# Patient Record
Sex: Female | Born: 1995 | Race: White | Hispanic: No | Marital: Single | Smoking: Never smoker
Health system: Southern US, Community
[De-identification: ages and names within clinical notes are randomized; demographics above are authoritative.]

## PROBLEM LIST (undated history)

## (undated) DIAGNOSIS — R4689 Other symptoms and signs involving appearance and behavior: Secondary | ICD-10-CM

## (undated) DIAGNOSIS — R569 Unspecified convulsions: Secondary | ICD-10-CM

## (undated) DIAGNOSIS — F840 Autistic disorder: Secondary | ICD-10-CM

## (undated) HISTORY — PX: MYRINGOTOMY WITH TUBE PLACEMENT: SHX5663

## (undated) HISTORY — PX: CARDIAC CATHETERIZATION: SHX172

---
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
## 2014-04-02 ENCOUNTER — Emergency Department: Payer: Self-pay | Admitting: Emergency Medicine

## 2014-04-02 LAB — CBC
HCT: 39.7 % (ref 35.0–47.0)
HGB: 13.6 g/dL (ref 12.0–16.0)
MCH: 31 pg (ref 26.0–34.0)
MCHC: 34.3 g/dL (ref 32.0–36.0)
MCV: 91 fL (ref 80–100)
Platelet: 236 10*3/uL (ref 150–440)
RBC: 4.38 10*6/uL (ref 3.80–5.20)
RDW: 13.3 % (ref 11.5–14.5)
WBC: 9.8 10*3/uL (ref 3.6–11.0)

## 2014-04-02 LAB — PREGNANCY, URINE: PREGNANCY TEST, URINE: NEGATIVE m[IU]/mL

## 2014-04-02 LAB — URINALYSIS, COMPLETE
BACTERIA: NONE SEEN
Bilirubin,UR: NEGATIVE
Blood: NEGATIVE
Glucose,UR: NEGATIVE mg/dL (ref 0–75)
Ketone: NEGATIVE
LEUKOCYTE ESTERASE: NEGATIVE
Nitrite: NEGATIVE
Ph: 8 (ref 4.5–8.0)
Protein: 30
RBC,UR: 1 /HPF (ref 0–5)
Specific Gravity: 1.015 (ref 1.003–1.030)
Squamous Epithelial: 2

## 2014-04-02 LAB — BASIC METABOLIC PANEL
Anion Gap: 9 (ref 7–16)
BUN: 13 mg/dL (ref 7–18)
CO2: 20 mmol/L — AB (ref 21–32)
Calcium, Total: 9.5 mg/dL (ref 9.0–10.7)
Chloride: 109 mmol/L — ABNORMAL HIGH (ref 98–107)
Creatinine: 0.77 mg/dL (ref 0.60–1.30)
EGFR (African American): >60
GLUCOSE: 81 mg/dL (ref 65–99)
Osmolality: 275 (ref 275–301)
Potassium: 4 mmol/L (ref 3.5–5.1)
Sodium: 138 mmol/L (ref 136–145)

## 2014-04-08 ENCOUNTER — Encounter (HOSPITAL_COMMUNITY): Payer: Self-pay | Admitting: Emergency Medicine

## 2014-04-08 ENCOUNTER — Emergency Department (HOSPITAL_COMMUNITY): Payer: 59

## 2014-04-08 ENCOUNTER — Emergency Department (HOSPITAL_COMMUNITY)
Admission: EM | Admit: 2014-04-08 | Discharge: 2014-04-09 | Disposition: A | Discharge status: Home / Self Care | Payer: 59 | Attending: Emergency Medicine | Admitting: Emergency Medicine

## 2014-04-08 DIAGNOSIS — F840 Autistic disorder: Secondary | ICD-10-CM | POA: Insufficient documentation

## 2014-04-08 DIAGNOSIS — Z3202 Encounter for pregnancy test, result negative: Secondary | ICD-10-CM | POA: Insufficient documentation

## 2014-04-08 DIAGNOSIS — Z79899 Other long term (current) drug therapy: Secondary | ICD-10-CM | POA: Insufficient documentation

## 2014-04-08 DIAGNOSIS — Z9889 Other specified postprocedural states: Secondary | ICD-10-CM | POA: Insufficient documentation

## 2014-04-08 DIAGNOSIS — G40909 Epilepsy, unspecified, not intractable, without status epilepticus: Secondary | ICD-10-CM | POA: Insufficient documentation

## 2014-04-08 DIAGNOSIS — IMO0002 Reserved for concepts with insufficient information to code with codable children: Secondary | ICD-10-CM | POA: Insufficient documentation

## 2014-04-08 DIAGNOSIS — Q353 Cleft soft palate: Secondary | ICD-10-CM | POA: Insufficient documentation

## 2014-04-08 DIAGNOSIS — R569 Unspecified convulsions: Secondary | ICD-10-CM

## 2014-04-08 HISTORY — DX: Other symptoms and signs involving appearance and behavior: R46.89

## 2014-04-08 HISTORY — DX: Unspecified convulsions: R56.9

## 2014-04-08 HISTORY — DX: Autistic disorder: F84.0

## 2014-04-08 LAB — RAPID URINE DRUG SCREEN, HOSP PERFORMED
Amphetamines: NOT DETECTED
Barbiturates: NOT DETECTED
Benzodiazepines: NOT DETECTED
COCAINE: NOT DETECTED
OPIATES: NOT DETECTED
TETRAHYDROCANNABINOL: NOT DETECTED

## 2014-04-08 LAB — URINALYSIS, ROUTINE W REFLEX MICROSCOPIC
Bilirubin Urine: NEGATIVE
Glucose, UA: NEGATIVE mg/dL
Hgb urine dipstick: NEGATIVE
Ketones, ur: NEGATIVE mg/dL
Leukocytes, UA: NEGATIVE
NITRITE: NEGATIVE
Protein, ur: 30 mg/dL — AB
SPECIFIC GRAVITY, URINE: 1.019 (ref 1.005–1.030)
UROBILINOGEN UA: 1 mg/dL (ref 0.0–1.0)
pH: 7.5 (ref 5.0–8.0)

## 2014-04-08 LAB — CBC WITH DIFFERENTIAL/PLATELET
BASOS ABS: 0 10*3/uL (ref 0.0–0.1)
Basophils Relative: 0 % (ref 0–1)
EOS ABS: 0.1 10*3/uL (ref 0.0–0.7)
Eosinophils Relative: 1 % (ref 0–5)
HCT: 39.9 % (ref 36.0–46.0)
HEMOGLOBIN: 13.3 g/dL (ref 12.0–15.0)
Lymphocytes Relative: 26 % (ref 12–46)
Lymphs Abs: 2.4 10*3/uL (ref 0.7–4.0)
MCH: 29.8 pg (ref 26.0–34.0)
MCHC: 33.3 g/dL (ref 30.0–36.0)
MCV: 89.3 fL (ref 78.0–100.0)
MONOS PCT: 4 % (ref 3–12)
Monocytes Absolute: 0.4 10*3/uL (ref 0.1–1.0)
NEUTROS ABS: 6.2 10*3/uL (ref 1.7–7.7)
Neutrophils Relative %: 69 % (ref 43–77)
PLATELETS: 272 10*3/uL (ref 150–400)
RBC: 4.47 MIL/uL (ref 3.87–5.11)
RDW: 13 % (ref 11.5–15.5)
WBC: 9 10*3/uL (ref 4.0–10.5)

## 2014-04-08 LAB — HEPATIC FUNCTION PANEL
ALBUMIN: 3.8 g/dL (ref 3.5–5.2)
ALK PHOS: 51 U/L (ref 39–117)
ALT: 20 U/L (ref 0–35)
AST: 22 U/L (ref 0–37)
Bilirubin, Direct: <0.2 mg/dL (ref 0.0–0.3)
Total Bilirubin: 0.2 mg/dL — ABNORMAL LOW (ref 0.3–1.2)
Total Protein: 7.7 g/dL (ref 6.0–8.3)

## 2014-04-08 LAB — URINE MICROSCOPIC-ADD ON

## 2014-04-08 LAB — PREGNANCY, URINE: Preg Test, Ur: NEGATIVE

## 2014-04-08 MED ORDER — LEVETIRACETAM IN NACL 500 MG/100ML IV SOLN
500.0000 mg | Freq: Once | INTRAVENOUS | Status: AC
  Administered 2014-04-09: 500 mg via INTRAVENOUS
  Filled 2014-04-08: qty 100

## 2014-04-08 NOTE — ED Notes (Addendum)
Pt transported from highway from vehicle after reported seizure activity. Pt did have urinary incontinence per EMS. Family states she had sx at 18 yo then last week after head injury Per mother unkn if TBI was due to seizure or if seizure caused her to hit her head on night stand

## 2014-04-08 NOTE — ED Notes (Signed)
Patient transported to CT 

## 2014-04-08 NOTE — ED Provider Notes (Signed)
CSN: [id number]     Arrival date & time 04/08/14  2021 History   First MD Initiated Contact with Patient 04/08/14 2150     Chief Complaint  Patient presents with  . Seizures     (Consider location/radiation/quality/duration/timing/severity/associated sxs/prior Treatment) HPI  Grace Garcia is a 18 y.o. female accompanied by her mother who provides most of the history, patient has moderate to severe autism complaining of seizure-like activity. This is the second time this week. Initially, patient was bouncing on her yoga ball came off of the ball hit her head against a TV stand briefly lost consciousness for about 30 seconds and was convulsing. This was witnessed by her younger sister who is 50 years old. There was no loss of our bladder continence, patient was confused afterwards she became ataxic as she was walking and hit her head against the wall second time. This is atypical for her she is normally very steady on her feet. Patient was evaluated at [facility] and had normal CT scan, blood work, urinalysis. Patient had been in her normal state of health this week when her grandparents are taking her out to dinner this evening she was sitting in the backseat: Grandmother states that patient's eyes rolled back in her head she began convulsing. This lasted for 1 minute. As per EMS there was loss of bladder continence. She was mildly confused afterward. Mother reports that she is eating and drinking normally. No fever chills or vomiting.   Past Medical History  Diagnosis Date  . Seizures   . Autistic behavior    Past Surgical History  Procedure Laterality Date  . Myringotomy with tube placement    . Cardiac catheterization      to repair heart defect with mesh   No family history on file. History  Substance Use Topics  . Smoking status: Never Smoker   . Smokeless tobacco: Not on file  . Alcohol Use: No   OB History   Grav Para Term Preterm Abortions TAB SAB Ect Mult Living                  Review of Systems  10 systems reviewed and found to be negative, except as noted in the HPI.   Allergies  Review of patient's allergies indicates no known allergies.  Home Medications   Prior to Admission medications   Medication Sig Start Date End Date Taking? Authorizing Provider  acetaminophen (TYLENOL) 500 MG tablet Take 500 mg by mouth every 6 (six) hours as needed (pain).   Yes Historical Provider, MD  levETIRAcetam (KEPPRA) 500 MG tablet Take 1 tablet (500 mg total) by mouth 2 (two) times daily. 04/09/14   Keilany Burnette, PA-C  mometasone (NASONEX) 50 MCG/ACT nasal spray Place 2 sprays into the nose daily.   Yes Historical Provider, MD  norgestimate-ethinyl estradiol (ORTHO-CYCLEN,SPRINTEC,PREVIFEM) 0.25-35 MG-MCG tablet Take 1 tablet by mouth daily.   Yes Historical Provider, MD  omega-3 acid ethyl esters (LOVAZA) 1 G capsule Take 1 g by mouth daily.   Yes Historical Provider, MD   BP 132/75  Pulse 82  Temp(Src) 98.1 F (36.7 C) (Oral)  Resp 18  Ht 5\' 1"  (1.549 m)  Wt 124 lb (56.246 kg)  BMI 23.44 kg/m2  SpO2 100%  LMP 02/06/2014 Physical Exam  Nursing note and vitals reviewed. Constitutional: She is oriented to person, place, and time. She appears well-developed and well-nourished. No distress.  Tearful  HENT:  Head: Normocephalic and atraumatic.  Mouth/Throat: Oropharynx is  clear and moist.  Bifid uvula  Eyes: Conjunctivae and EOM are normal. Pupils are equal, round, and reactive to light.  Neck: Normal range of motion. Neck supple.  Cardiovascular: Normal rate, regular rhythm and intact distal pulses.   Pulmonary/Chest: Effort normal and breath sounds normal. No stridor. No respiratory distress. She has no wheezes. She has no rales. She exhibits no tenderness.  Abdominal: Soft. Bowel sounds are normal. She exhibits no distension and no mass. There is no tenderness. There is no rebound and no guarding.  Musculoskeletal: Normal range of motion.   Neurological: She is alert and oriented to person, place, and time.  Verbal but very slow to respond, follows commands, moving all extremities  Psychiatric: She has a normal mood and affect.    ED Course  Procedures (including critical care time) Labs Review Labs Reviewed  URINALYSIS, ROUTINE W REFLEX MICROSCOPIC - Abnormal; Notable for the following:    Protein, ur 30 (*)    All other components within normal limits  HEPATIC FUNCTION PANEL - Abnormal; Notable for the following:    Total Bilirubin 0.2 (*)    All other components within normal limits  CBC WITH DIFFERENTIAL  URINE RAPID DRUG SCREEN (HOSP PERFORMED)  PREGNANCY, URINE  URINE MICROSCOPIC-ADD ON    Imaging Review Ct Head Wo Contrast  04/09/2014   CLINICAL DATA:  Seizure.  EXAM: CT HEAD WITHOUT CONTRAST  TECHNIQUE: Contiguous axial images were obtained from the base of the skull through the vertex without intravenous contrast.  COMPARISON:  CT of the head April 02, 2014  FINDINGS: The ventricles and sulci are normal. No intraparenchymal hemorrhage, mass effect nor midline shift. No acute large vascular territory infarcts.  No abnormal extra-axial fluid collections. Basal cisterns are patent.  No skull fracture. The included ocular globes and orbital contents are non-suspicious. Completed soft tissue opacification of the right maxillary sinus, incompletely characterized, unchanged. Under pneumatized mastoid air cells with soft tissue effacing left middle ear and mastoid air cells.  IMPRESSION: No acute intracranial process; stable appearance of the head from April 02, 2014.  Left middle ear and mastoid effusion. Right maxillary probable mucosal retention cyst versus mucocele, incompletely characterized.   Electronically Signed   By: Awilda Metroourtnay  Bloomer   On: 04/09/2014 00:18     EKG Interpretation None      MDM   Final diagnoses:  Seizure-like activity    Filed Vitals:   04/08/14 2145 04/09/14 0037  BP: 129/73 132/75   Pulse: 99 82  Temp: 98.1 F (36.7 C)   TempSrc: Oral   Resp: 18 18  Height: 5\' 1"  (1.549 m)   Weight: 124 lb (56.246 kg)   SpO2: 100% 100%    Medications  levETIRAcetam (KEPPRA) IVPB 500 mg/100 mL premix (500 mg Intravenous Given 04/09/14 0018)    Martha ClanMackenzie E Debruler is a 18 y.o. female presenting with seizure-like activity 2 times this week. Patient had trauma earlier in the normal CT. Full neuro exam is not possible do to her autism. However repair exam is nonfocal. Case discussed with attending physician. Plan is to obtain basic blood work and urinalysis discuss with neurology.   Blood work and urinalysis with no abnormalities. Neurology consult from Dr. Roseanne RenoStewart appreciated: Recommends getting a second head CT tonight. Recommends loading with 500 mg IV Her and starting her on 500 twice a day.   Evaluation does not show pathology that would require ongoing emergent intervention or inpatient treatment. Pt is hemodynamically stable and mentating appropriately. Discussed  findings and plan with patient/guardian, who agrees with care plan. All questions answered. Return precautions discussed and outpatient follow up given.   Discharge Medication List as of 04/09/2014 12:26 AM    START taking these medications   Details  levETIRAcetam (KEPPRA) 500 MG tablet Take 1 tablet (500 mg total) by mouth 2 (two) times daily., Starting 04/09/2014, Until Discontinued, [employer], PA-C 04/09/14 [phone]

## 2014-04-09 MED ORDER — LEVETIRACETAM 500 MG PO TABS: 500.0000 mg | ORAL_TABLET | Freq: Two times a day (BID) | ORAL | Status: DC

## 2014-04-09 NOTE — Discharge Instructions (Signed)
Please follow with your primary care doctor in the next 2 days for a check-up. They must obtain records for further management.   Do not hesitate to return to the Emergency Department for any new, worsening or concerning symptoms.    Epilepsy Epilepsy is a disorder in which a person has repeated seizures over time. A seizure is a release of abnormal electrical activity in the brain. Seizures can cause a change in attention, behavior, or the ability to remain awake and alert (altered mental status). Seizures often involve uncontrollable shaking (convulsions).  Most people with epilepsy lead normal lives. However, people with epilepsy are at an increased risk of falls, accidents, and injuries. Therefore, it is important to begin treatment right away. CAUSES  Epilepsy has many possible causes. Anything that disturbs the normal pattern of brain cell activity can lead to seizures. This may include:   Head injury.  Birth trauma.  High fever as a child.  Stroke.  Bleeding into or around the brain.  Certain drugs.  Prolonged low oxygen, such as what occurs after CPR efforts.  Abnormal brain development.  Certain illnesses, such as meningitis, encephalitis (brain infection), malaria, and other infections.  An imbalance of nerve signaling chemicals (neurotransmitters).  SIGNS AND SYMPTOMS  The symptoms of a seizure can vary greatly from one person to another. Right before a seizure, you may have a warning (aura) that a seizure is about to occur. An aura may include the following symptoms:  Fear or anxiety.  Nausea.  Feeling like the room is spinning (vertigo).  Vision changes, such as seeing flashing lights or spots. Common symptoms during a seizure include:  Abnormal sensations, such as an abnormal smell or a bitter taste in the mouth.   Sudden, general body stiffness.   Convulsions that involve rhythmic jerking of the face, arm, or leg on one or both sides.   Sudden change  in consciousness.   Appearing to be awake but not responding.   Appearing to be asleep but cannot be awakened.   Grimacing, chewing, lip smacking, drooling, tongue biting, or loss of bowel or bladder control. After a seizure, you may feel sleepy for a while. DIAGNOSIS  Your health care provider will ask about your symptoms and take a medical history. Descriptions from any witnesses to your seizures will be very helpful in the diagnosis. A physical exam, including a detailed neurological exam, is necessary. Various tests may be done, such as:   An electroencephalogram (EEG). This is a painless test of your brain waves. In this test, a diagram is created of your brain waves. These diagrams can be interpreted by a specialist.  An MRI of the brain.   A CT scan of the brain.   A spinal tap (lumbar puncture, LP).  Blood tests to check for signs of infection or abnormal blood chemistry. TREATMENT  There is no cure for epilepsy, but it is generally treatable. Once epilepsy is diagnosed, it is important to begin treatment as soon as possible. For most people with epilepsy, seizures can be controlled with medicines. The following may also be used:  A pacemaker for the brain (vagus nerve stimulator) can be used for people with seizures that are not well controlled by medicine.  Surgery on the brain. For some people, epilepsy eventually goes away. HOME CARE INSTRUCTIONS   Follow your health care provider's recommendations on driving and safety in normal activities.  Get enough rest. Lack of sleep can cause seizures.  Only take over-the-counter or  prescription medicines as directed by your health care provider. Take any prescribed medicine exactly as directed.  Avoid any known triggers of your seizures.  Keep a seizure diary. Record what you recall about any seizure, especially any possible trigger.   Make sure the people you live and work with know that you are prone to seizures.  They should receive instructions on how to help you. In general, a witness to a seizure should:   Cushion your head and body.   Turn you on your side.   Avoid unnecessarily restraining you.   Not place anything inside your mouth.   Call for emergency medical help if there is any question about what has occurred.   Follow up with your health care provider as directed. You may need regular blood tests to monitor the levels of your medicine.  SEEK MEDICAL CARE IF:   You develop signs of infection or other illness. This might increase the risk of a seizure.   You seem to be having more frequent seizures.   Your seizure pattern is changing.  SEEK IMMEDIATE MEDICAL CARE IF:   You have a seizure that does not stop after a few moments.   You have a seizure that causes any difficulty in breathing.   You have a seizure that results in a very severe headache.   You have a seizure that leaves you with the inability to speak or use a part of your body.  Document Released: 09/14/2005 Document Revised: 07/05/2013 Document Reviewed: 04/26/2013 [facility] Patient Information 2015 [place], [place]. This information is not intended to replace advice given to you by your health care provider. Make sure you discuss any questions you have with your health care provider.

## 2014-04-11 NOTE — ED Provider Notes (Signed)
Medical screening examination/treatment/procedure(s) were performed by non-physician practitioner and as supervising physician I was immediately available for consultation/collaboration.    Linwood DibblesJon Doral Ventrella, MD 04/11/14 [phone]

## 2014-04-28 DIAGNOSIS — R569 Unspecified convulsions: Secondary | ICD-10-CM | POA: Insufficient documentation

## 2016-01-07 ENCOUNTER — Ambulatory Visit (INDEPENDENT_AMBULATORY_CARE_PROVIDER_SITE_OTHER): Payer: 59 | Admitting: Family Medicine

## 2016-01-07 ENCOUNTER — Other Ambulatory Visit: Payer: Self-pay

## 2016-01-07 ENCOUNTER — Encounter: Payer: Self-pay | Admitting: Family Medicine

## 2016-01-07 VITALS — BP 92/60 | HR 82 | Temp 98.2°F | Resp 14 | Ht 63.0 in | Wt 131.0 lb

## 2016-01-07 DIAGNOSIS — R569 Unspecified convulsions: Secondary | ICD-10-CM

## 2016-01-07 DIAGNOSIS — F840 Autistic disorder: Secondary | ICD-10-CM | POA: Diagnosis not present

## 2016-01-07 DIAGNOSIS — N926 Irregular menstruation, unspecified: Secondary | ICD-10-CM

## 2016-01-07 DIAGNOSIS — Z7189 Other specified counseling: Secondary | ICD-10-CM | POA: Diagnosis not present

## 2016-01-07 DIAGNOSIS — Z8669 Personal history of other diseases of the nervous system and sense organs: Secondary | ICD-10-CM | POA: Diagnosis not present

## 2016-01-07 DIAGNOSIS — Z7689 Persons encountering health services in other specified circumstances: Secondary | ICD-10-CM

## 2016-01-07 NOTE — Progress Notes (Signed)
Patient ID: Grace Garcia, female   DOB: 1995/12/03, 20 y.o.   MRN: [id number]    Subjective:  HPI  Patient is here to get established. She use to see [facility].  She would like to get irregular periods discussed. This has been an issue for 2 years, she has tried different BC medications-oral only. She has been on [facility] for 1 year now. Per patient's mom who is present with patient today, patient usually will have a period every 1 to 2 months and it was last for about 3 weeks. Flow is light. She does not see a gynecologist. Has been on continuous pills for over a year now as is much slower than usual when on her cycle.   Is also on medication for seizure disorder. Tolerating it well. No side effects. No recent seizures.   Going to participate in [employer] this fall if she is accepted.    Prior to Admission medications   Medication Sig Start Date End Date Taking? Authorizing Provider  acetaminophen (TYLENOL) 500 MG tablet Take 500 mg by mouth every 6 (six) hours as needed (pain).   Yes Historical Provider, MD  CIPRODEX otic suspension As needed 12/20/15  Yes Historical Provider, MD  lamoTRIgine (LAMICTAL) 100 MG tablet 1 1/2 tablets twice daily 12/20/15  Yes Historical Provider, MD  levETIRAcetam (KEPPRA) 750 MG tablet Take 750 mg by mouth 2 (two) times daily.  10/22/15  Yes Historical Provider, MD  mometasone (NASONEX) 50 MCG/ACT nasal spray Place 2 sprays into the nose daily.   Yes Historical Provider, MD  norgestimate-ethinyl estradiol (ORTHO-CYCLEN,SPRINTEC,PREVIFEM) 0.25-35 MG-MCG tablet Take 1 tablet by mouth daily.   Yes Historical Provider, MD    Patient Active Problem List   Diagnosis Date Noted  . Active autistic disorder 01/07/2016  . Seizure (HCC) 04/28/2014    Past Medical History  Diagnosis Date  . Seizures (HCC)   . Autistic behavior     Social History   Social History  . Marital Status: Single    Spouse Name: N/A  . Number of Children: N/A    . Years of Education: N/A   Occupational History  . Not on file.   Social History Main Topics  . Smoking status: Never Smoker   . Smokeless tobacco: Never Used  . Alcohol Use: No  . Drug Use: No  . Sexual Activity: No   Other Topics Concern  . Not on file   Social History Narrative    No Known Allergies  Review of Systems  Constitutional: Negative.   Respiratory: Negative.   Cardiovascular: Negative.   Musculoskeletal: Negative.   Psychiatric/Behavioral: Negative.     Objective:  BP 92/60 mmHg  Pulse 82  Temp(Src) 98.2 F (36.8 C)  Resp 14  Ht  (1.6 m)  Wt 131 lb (59.421 kg)  BMI 23.21 kg/m2  LMP 01/02/2016  Physical Exam  Constitutional: She is well-developed, well-nourished, and in no distress.  HENT:  Right Ear: External ear normal.  Ear tubes are present bilateral  Neck: Normal range of motion. Neck supple. No thyromegaly present.  Cardiovascular: Normal rate, regular rhythm, normal heart sounds and intact distal pulses.   No murmur heard. Pulmonary/Chest: Effort normal and breath sounds normal. No respiratory distress. She has no wheezes.    Lab Results  Component Value Date   WBC 9.0 04/08/2014   HGB 13.3 04/08/2014   HCT 39.9 04/08/2014   PLT 272 04/08/2014   GLUCOSE 81 04/02/2014    CMP  Component Value Date/Time   NA 138 04/02/2014 1230   K 4.0 04/02/2014 1230   CL 109* 04/02/2014 1230   CO2 20* 04/02/2014 1230   GLUCOSE 81 04/02/2014 1230   BUN 13 04/02/2014 1230   CREATININE 0.77 04/02/2014 1230   CALCIUM 9.5 04/02/2014 1230   PROT 7.7 04/08/2014 2221   ALBUMIN 3.8 04/08/2014 2221   AST 22 04/08/2014 2221   ALT 20 04/08/2014 2221   ALKPHOS 51 04/08/2014 2221   BILITOT 0.2* 04/08/2014 2221   GFRNONAA >60 04/02/2014 1230   GFRAA >60 04/02/2014 1230    Assessment and Plan :  1. Encounter to establish care [facility]  2. Irregular menstrual cycle Medications patient is on could be  contributing to the irregular menstrual cycle. Will stop her current birth control medication for now, and then will consider a new medication after she has a menstrual cycle after stopping medication. Will decide how to proceed after this.   3. Seizure (HCC) Stable at this time on medications.  4. Active autistic disorder Patient's mother is present with patient today.  stable.  5. History of recurrent ear infection Ear tubes are present today bilateral. Stable.  Patient was seen and examined by Dr. Lorie PhenixNancy Enos Muhl and note was scribed by Samara DeistAnastasiya Aleksandrova, RMA.  I have reviewed the document for accuracy and completeness and I agree with above. - Leo GrosserNancy J. Cortney Beissel, MD    Lorie PhenixNancy Arrayah Connors, MD  [facility] 01/07/2016 11:38 AM

## 2016-01-14 DIAGNOSIS — Z79899 Other long term (current) drug therapy: Secondary | ICD-10-CM | POA: Diagnosis not present

## 2016-01-14 DIAGNOSIS — R569 Unspecified convulsions: Secondary | ICD-10-CM | POA: Diagnosis not present

## 2016-01-17 ENCOUNTER — Other Ambulatory Visit: Payer: Self-pay | Admitting: Family Medicine

## 2016-01-17 MED ORDER — NORGESTIMATE-ETH ESTRADIOL 0.25-35 MG-MCG PO TABS: 1.0000 | ORAL_TABLET | Freq: Every day | ORAL | Status: DC

## 2016-02-01 DIAGNOSIS — R569 Unspecified convulsions: Secondary | ICD-10-CM | POA: Diagnosis not present

## 2016-04-24 ENCOUNTER — Other Ambulatory Visit: Payer: Self-pay

## 2016-04-24 DIAGNOSIS — Z111 Encounter for screening for respiratory tuberculosis: Secondary | ICD-10-CM

## 2016-04-24 DIAGNOSIS — F840 Autistic disorder: Secondary | ICD-10-CM

## 2016-04-24 DIAGNOSIS — R569 Unspecified convulsions: Secondary | ICD-10-CM

## 2016-04-29 ENCOUNTER — Other Ambulatory Visit: Payer: Self-pay | Admitting: Family Medicine

## 2016-04-29 DIAGNOSIS — Z111 Encounter for screening for respiratory tuberculosis: Secondary | ICD-10-CM | POA: Diagnosis not present

## 2016-04-29 DIAGNOSIS — R569 Unspecified convulsions: Secondary | ICD-10-CM | POA: Diagnosis not present

## 2016-05-02 LAB — QUANTIFERON IN TUBE
QFT TB AG MINUS NIL VALUE: 0 IU/mL
QUANTIFERON MITOGEN VALUE: 5.24 IU/mL
QUANTIFERON TB AG VALUE: 0.02 IU/mL
QUANTIFERON TB GOLD: NEGATIVE
Quantiferon Nil Value: 0.02 IU/mL

## 2016-05-02 LAB — QUANTIFERON TB GOLD ASSAY (BLOOD)

## 2016-05-02 LAB — LAMOTRIGINE LEVEL: LAMOTRIGINE LVL: 4.2 ug/mL (ref 2.0–20.0)

## 2016-06-02 ENCOUNTER — Ambulatory Visit (INDEPENDENT_AMBULATORY_CARE_PROVIDER_SITE_OTHER): Payer: 59 | Admitting: Family Medicine

## 2016-06-02 VITALS — BP 98/64 | HR 72 | Temp 98.4°F | Resp 16 | Wt 132.0 lb

## 2016-06-02 DIAGNOSIS — H6691 Otitis media, unspecified, right ear: Secondary | ICD-10-CM | POA: Diagnosis not present

## 2016-06-02 DIAGNOSIS — F840 Autistic disorder: Secondary | ICD-10-CM | POA: Diagnosis not present

## 2016-06-02 DIAGNOSIS — R569 Unspecified convulsions: Secondary | ICD-10-CM

## 2016-06-02 MED ORDER — AMOXICILLIN-POT CLAVULANATE 875-125 MG PO TABS: 1.0000 | ORAL_TABLET | Freq: Two times a day (BID) | ORAL | 2 refills | Status: DC

## 2016-06-02 NOTE — Progress Notes (Signed)
Subjective:  HPI  Patient started to have right ear drainage last night and this morning. No fever. No pain. She has had some shakiness/petitmal seizures. She has been using Ciprodex drops that usually help but she ran out of them.  Prior to Admission medications   Medication Sig Start Date End Date Taking? Authorizing Provider  CIPRODEX otic suspension As needed 12/20/15  Yes Historical Provider, MD  lamoTRIgine (LAMICTAL) 100 MG tablet 1 1/2 tablets twice daily 12/20/15  Yes Historical Provider, MD  levETIRAcetam (KEPPRA) 750 MG tablet Take 750 mg by mouth 2 (two) times daily.  10/22/15  Yes Historical Provider, MD  mometasone (NASONEX) 50 MCG/ACT nasal spray Place 2 sprays into the nose daily.   Yes Historical Provider, MD  norgestimate-ethinyl estradiol (ORTHO-CYCLEN,SPRINTEC,PREVIFEM) 0.25-35 MG-MCG tablet Take 1 tablet by mouth daily. 01/17/16  Yes Margarita Rana, MD  lamoTRIgine (LAMICTAL) 150 MG tablet Take 1 tablet by mouth daily. 04/20/16   Historical Provider, MD    Patient Active Problem List   Diagnosis Date Noted  . Active autistic disorder 01/07/2016  . Seizure ([place]) 04/28/2014    Past Medical History:  Diagnosis Date  . Autistic behavior   . Seizures [facility])     Social History   Social History  . Marital status: Single    Spouse name: N/A  . Number of children: N/A  . Years of education: N/A   Occupational History  . Not on file.   Social History Main Topics  . Smoking status: Never Smoker  . Smokeless tobacco: Never Used  . Alcohol use No  . Drug use: No  . Sexual activity: No   Other Topics Concern  . Not on file   Social History Narrative  . No narrative on file    No Known Allergies  Review of Systems  Constitutional: Negative.   HENT: Positive for ear discharge.   Respiratory: Negative.   Cardiovascular: Negative.   Skin: Negative.   Neurological:       Mild seizures.  Endo/Heme/Allergies: Negative.   Psychiatric/Behavioral: Negative.      Immunization History  Administered Date(s) Administered  . DTaP 11/30/1994, 02/02/1995, 04/05/1995, 05/02/1996, 04/28/1999  . HPV Quadrivalent 01/27/2011, 02/16/2012, 07/19/2012  . Hepatitis A 06/07/2007, 07/10/2008  . Hepatitis B Sep 02, 1994, 10/29/1994, 06/16/1995  . HiB (PRP-OMP) 11/30/1994, 02/02/1995, 04/05/1995, 01/04/1996  . IPV 11/30/1994, 02/02/1995, 04/05/1995, 04/28/1999  . Influenza-Unspecified 07/04/2015  . MMR 05/02/1996, 04/28/1999  . Meningococcal Conjugate 01/27/2011  . Tdap 06/07/2007  . Varicella 04/28/1999, 06/07/2007   Objective:  BP 98/64   Pulse 72   Temp 98.4 F (36.9 C)   Resp 16   Wt 132 lb (59.9 kg)   BMI 23.38 kg/m   Physical Exam  Constitutional: She is oriented to person, place, and time and well-developed, well-nourished, and in no distress.  HENT:  Head: Normocephalic and atraumatic.  Right Ear: There is drainage.  Left Ear: External ear normal.  Nose: Nose normal.  Ear tube present in the right and  left ear. Small amount of serous/pus drainage from right myringotomy tube. The EAC appears to be completely normal  Eyes: Conjunctivae are normal.  Neck: No thyromegaly present.  Cardiovascular: Normal rate, regular rhythm, normal heart sounds and intact distal pulses.   No murmur heard. Pulmonary/Chest: Effort normal and breath sounds normal. No respiratory distress. She has no wheezes.  Lymphadenopathy:    She has no cervical adenopathy.  Neurological: She is alert and oriented to person, place, and time.  Skin: Skin is warm and dry.  Psychiatric: Mood and affect normal.    Lab Results  Component Value Date   WBC 9.0 04/08/2014   HGB 13.3 04/08/2014   HCT 39.9 04/08/2014   PLT 272 04/08/2014   GLUCOSE 81 04/02/2014    CMP     Component Value Date/Time   NA 138 04/02/2014 1230   K 4.0 04/02/2014 1230   CL 109 (H) 04/02/2014 1230   CO2 20 (L) 04/02/2014 1230   GLUCOSE 81 04/02/2014 1230   BUN 13 04/02/2014 1230    CREATININE 0.77 04/02/2014 1230   CALCIUM 9.5 04/02/2014 1230   PROT 7.7 04/08/2014 2221   ALBUMIN 3.8 04/08/2014 2221   AST 22 04/08/2014 2221   ALT 20 04/08/2014 2221   ALKPHOS 51 04/08/2014 2221   BILITOT 0.2 (L) 04/08/2014 2221   GFRNONAA >60 04/02/2014 1230   GFRAA >60 04/02/2014 1230    Assessment and Plan :  1. Recurrent acute otitis media of right ear, unspecified otitis media type Will treat with Augmentin. Follow as needed. Advised patient and mother not to use Ciprodex drops at this time.May need referral back to ENT. 2. Autism/seizure disorder HPI, Exam and A&P transcribed under the direction and in the presence of Miguel Aschoff, MD.    06/02/2016 4:09 PM

## 2016-06-20 ENCOUNTER — Ambulatory Visit (INDEPENDENT_AMBULATORY_CARE_PROVIDER_SITE_OTHER): Payer: 59

## 2016-06-20 DIAGNOSIS — Z23 Encounter for immunization: Secondary | ICD-10-CM

## 2016-07-31 ENCOUNTER — Other Ambulatory Visit: Payer: Self-pay

## 2016-08-27 ENCOUNTER — Encounter: Payer: Self-pay | Admitting: Family Medicine

## 2016-08-27 ENCOUNTER — Other Ambulatory Visit: Payer: Self-pay

## 2016-08-27 ENCOUNTER — Ambulatory Visit (INDEPENDENT_AMBULATORY_CARE_PROVIDER_SITE_OTHER): Payer: 59 | Admitting: Family Medicine

## 2016-08-27 VITALS — BP 100/64 | HR 92 | Temp 98.2°F | Resp 16 | Wt 136.0 lb

## 2016-08-27 DIAGNOSIS — R569 Unspecified convulsions: Secondary | ICD-10-CM

## 2016-08-27 NOTE — Progress Notes (Signed)
Subjective:     Patient ID: Grace Garcia, female   DOB: 1995/10/29, 20 y.o.   MRN: [id number]  HPI  Chief Complaint  Patient presents with  . Seizures    Pt experienced 2 seizures last night, and mom is concerned that there may be an underlying infection causing the seizures. Pt denies ear pain, sinus pain/pressure.  Denies cold symptoms or ear drainage.   Review of Systems     Objective:   Physical Exam  Constitutional: She appears well-developed and well-nourished. No distress.  Ears: Right tympanostomy tube appears aligned without drainage or surrounding inflammation; Left tympanostomy tube is malaligned without drainage or surrounding inflammation. Throat: no tonsillar enlargement or exudate Neck: no cervical adenopathy Lungs: clear     Assessment:    1. Seizure [facility](HCC): no clearcut infectious cause for recent increased frequency of focal seizures    Plan:    F/u with neurology as scheduled. Further f/u if signs of infection.

## 2016-08-27 NOTE — Patient Instructions (Signed)
F/u as scheduled with neurology. Return if you seen any new symptoms of infection.

## 2016-08-29 ENCOUNTER — Ambulatory Visit
Admission: EM | Admit: 2016-08-29 | Discharge: 2016-08-29 | Disposition: A | Discharge status: Home / Self Care | Payer: 59 | Attending: Family Medicine | Admitting: Family Medicine

## 2016-08-29 ENCOUNTER — Encounter: Payer: Self-pay | Admitting: Emergency Medicine

## 2016-08-29 DIAGNOSIS — H6693 Otitis media, unspecified, bilateral: Secondary | ICD-10-CM

## 2016-08-29 DIAGNOSIS — N390 Urinary tract infection, site not specified: Secondary | ICD-10-CM | POA: Diagnosis not present

## 2016-08-29 LAB — URINALYSIS COMPLETE WITH MICROSCOPIC (ARMC ONLY)
Bilirubin Urine: NEGATIVE
Glucose, UA: NEGATIVE mg/dL
Ketones, ur: NEGATIVE mg/dL
Nitrite: NEGATIVE
PH: 7.5 (ref 5.0–8.0)
PROTEIN: NEGATIVE mg/dL
Specific Gravity, Urine: 1.02 (ref 1.005–1.030)

## 2016-08-29 MED ORDER — SULFAMETHOXAZOLE-TRIMETHOPRIM 800-160 MG PO TABS: 1.0000 | ORAL_TABLET | Freq: Two times a day (BID) | ORAL | 0 refills | Status: AC

## 2016-08-29 NOTE — Discharge Instructions (Signed)
Take medication as prescribed. Rest. Drink plenty of fluids.   Use ciprodex 4 drops each ear, twice a day for 7 days.   Follow up with your primary care physician this week. Return to Urgent care for new or worsening concerns.

## 2016-08-29 NOTE — ED Provider Notes (Signed)
[facility] ____________________________________________  Time seen: Approximately 3:56 PM  I have reviewed the triage vital signs and the nursing notes.   HISTORY  Chief Complaint Dysuria   Historian: Mother  HPI Martha ClanMackenzie E Acklin is a 20 y.o. female with history of seizures and autism, presenting with mother for complaint of urinary frequency. Mother reports that patient did have a seizure this past Wednesday, and per mother patient usually has his a seizure around the time she has an infection. Mother states that she was seen by patient's doctor day after seizure, and no source of infection or abnormality found. Mother states last seizure was typical for patient without any concerns of head injury or trauma. Mother reports she then began to notice patient having frequent urination.  Mother reports that patient does often have ear infections and sinus infections. Denies any runny nose, nasal congestion or cough. Denies fevers. Denies abdominal pain appearance. Mother states that patient does not typically tell her when she is in pain but is more irritable and reports that patient has been more irritable the last few days. Denies recent sickness or recent antibiotic use.  Patient's last menstrual period was 08/20/2016.   Past Medical History:  Diagnosis Date  . Autistic behavior   . Seizures [facility](HCC)     Patient Active Problem List   Diagnosis Date Noted  . Active autistic disorder 01/07/2016  . Seizure (HCC) 04/28/2014    Past Surgical History:  Procedure Laterality Date  . ASD REPAIR  2002  . CARDIAC CATHETERIZATION     to repair heart defect with mesh  . MYRINGOTOMY WITH TUBE PLACEMENT      Current Outpatient Rx  . Order #: [id number] Class: Historical Med  . Order #: [id number] Class: Historical Med  . Order #: [id number] Class: Historical Med  . Order #: [id number] Class: Historical Med  . Order #: [id number] Class: Normal  . Order #: [id number] Class: Normal     No current facility-administered medications for this encounter.   Current Outpatient Prescriptions:  .  DIASTAT ACUDIAL 20 MG GEL, , Disp: , Rfl: 0 .  lamoTRIgine (LAMICTAL) 150 MG tablet, Take 1 tablet by mouth daily., Disp: , Rfl: 3 .  levETIRAcetam (KEPPRA) 750 MG tablet, Take 750 mg by mouth 2 (two) times daily. , Disp: , Rfl:  .  mometasone (NASONEX) 50 MCG/ACT nasal spray, Place 2 sprays into the nose daily., Disp: , Rfl:  .  norgestimate-ethinyl estradiol (ORTHO-CYCLEN,SPRINTEC,PREVIFEM) 0.25-35 MG-MCG tablet, Take 1 tablet by mouth daily., Disp: 3 Package, Rfl: 3 .  sulfamethoxazole-trimethoprim (BACTRIM DS,SEPTRA DS) 800-160 MG tablet, Take 1 tablet by mouth 2 (two) times daily., Disp: 10 tablet, Rfl: 0  Allergies Patient has no known allergies.  Family History  Problem Relation Age of Onset  . ADD / ADHD Brother   . Heart disease Paternal Grandfather   . Diabetes Maternal Grandfather   . Hypertension Maternal Grandfather     Social History Social History  Substance Use Topics  . Smoking status: Never Smoker  . Smokeless tobacco: Never Used  . Alcohol use No    Review of Systems Constitutional: No fever/chills Eyes: No visual changes. ENT: No sore throat. Cardiovascular: Denies chest pain. Respiratory: Denies shortness of breath. Gastrointestinal: No abdominal pain.  No nausea, no vomiting.  No diarrhea.  No constipation. Genitourinary: As above. Musculoskeletal: Negative for back pain. Skin: Negative for rash. Neurological: Negative for headaches, focal weakness or numbness.  10-point ROS otherwise negative.  ____________________________________________   PHYSICAL EXAM:  VITAL SIGNS:  ED Triage Vitals  Enc Vitals Group     BP 08/29/16 1508 115/72     Pulse Rate 08/29/16 1508 86     Resp 08/29/16 1508 16     Temp 08/29/16 1508 98 F (36.7 C)     Temp Source 08/29/16 1508 Tympanic     SpO2 08/29/16 1508 100 %     Weight 08/29/16 1508 136 lb  (61.7 kg)     Height 08/29/16 1508 5\' 2"  (1.575 m)     Head Circumference --      Peak Flow --      Pain Score 08/29/16 1509 2     Pain Loc --      Pain Edu? --      Excl. in GC? --     Constitutional: Alert and oriented. Well appearing and in no acute distress. Eyes: Conjunctivae are normal. PERRL. EOMI. ENT      Head: Normocephalic and atraumatic.      Ears: Nontender bilaterally, bilateral tubes present with yellowish otorrhea, and minimal erythema. No surrounding erythema or tenderness bilaterally.          Nose: No congestion/rhinnorhea.      Mouth/Throat: Mucous membranes are moist.Oropharynx non-erythematous. Neck: No stridor. Supple without meningismus.  Hematological/Lymphatic/Immunilogical: No cervical lymphadenopathy. Cardiovascular: Normal rate, regular rhythm. Grossly normal heart sounds.  Good peripheral circulation. Respiratory: Normal respiratory effort without tachypnea nor retractions. Breath sounds are clear and equal bilaterally. No wheezes/rales/rhonchi.. Gastrointestinal: Soft and nontender. No distention. No CVA tenderness. Musculoskeletal:  Steady gait. No midline cervical, thoracic or lumbar tenderness to palpation.  Skin:  Skin is warm, dry and intact. No rash noted. Psychiatric: Mood and affect are normal. Speech and behavior are normal. Patient exhibits appropriate insight and judgment   ___________________________________________   LABS (all labs ordered are listed, but only abnormal results are displayed)  Labs Reviewed  URINALYSIS COMPLETEWITH MICROSCOPIC ([facility] ONLY) - Abnormal; Notable for the following:       Result Value   Hgb urine dipstick TRACE (*)    Leukocytes, UA TRACE (*)    Bacteria, UA FEW (*)    Squamous Epithelial / LPF 0-5 (*)    All other components within normal limits  URINE CULTURE    PROCEDURES Procedures     INITIAL IMPRESSION / ASSESSMENT AND PLAN / ED COURSE  Pertinent labs & imaging results that were available  during my care of the patient were reviewed by me and considered in my medical decision making (see chart for details).  Very well-appearing patient. Mother historian at bedside. Suspect as few bacteria present with urinary frequency, urinary tract infection, will culture urine and treat patient empirically with oral Bactrim. Patient noted to have bilateral tympanostomy tube otorrhea and suspect bacterial infection and will treat with Ciprodex. Mother states that she has Ciprodex at home that is up-to-date and will use, and does not want new prescription. Encouraged supportive care, fluids and close PCP follow-up. Discussed indication, risks and benefits of medications with patient and mother.   Discussed follow up with Primary care physician this week. Discussed follow up and return parameters including no resolution or any worsening concerns. Patient and mother verbalized understanding and agreed to plan.   ____________________________________________   FINAL CLINICAL IMPRESSION(S) / ED DIAGNOSES  Final diagnoses:  Urinary tract infection without hematuria, site unspecified  Bilateral otitis media, unspecified otitis media type     Discharge Medication List as of 08/29/2016  4:12 PM    START taking these  medications   Details  sulfamethoxazole-trimethoprim (BACTRIM DS,SEPTRA DS) 800-160 MG tablet Take 1 tablet by mouth 2 (two) times daily., Starting Sat 08/29/2016, Until Thu 09/03/2016, Normal        Note: This dictation was prepared with Dragon dictation along with smaller phrase technology. Any transcriptional errors that result from this process are unintentional.    Clinical Course       Renford DillsLindsey Bralyn Espino, NP 08/29/16 1640

## 2016-08-29 NOTE — ED Triage Notes (Signed)
Mother states that her daughter has had urinary frequency and irritability for the past 3 days. Mother denies fevers.

## 2016-08-31 LAB — URINE CULTURE: Culture: <10000 — AB

## 2016-09-01 ENCOUNTER — Telehealth: Payer: Self-pay

## 2016-10-19 ENCOUNTER — Other Ambulatory Visit: Payer: Self-pay | Admitting: Family Medicine

## 2016-10-19 NOTE — Telephone Encounter (Signed)
Please review-aa 

## 2016-10-27 ENCOUNTER — Ambulatory Visit (INDEPENDENT_AMBULATORY_CARE_PROVIDER_SITE_OTHER): Payer: 59 | Admitting: Family Medicine

## 2016-10-27 ENCOUNTER — Encounter: Payer: Self-pay | Admitting: Family Medicine

## 2016-10-27 VITALS — BP 114/82 | HR 70 | Temp 97.9°F | Resp 14

## 2016-10-27 DIAGNOSIS — H6612 Chronic tubotympanic suppurative otitis media, left ear: Secondary | ICD-10-CM

## 2016-10-27 MED ORDER — AMOXICILLIN-POT CLAVULANATE 875-125 MG PO TABS: 1.0000 | ORAL_TABLET | Freq: Two times a day (BID) | ORAL | 2 refills | Status: DC

## 2016-10-27 NOTE — Progress Notes (Signed)
   Grace Garcia  MRN: [id number] DOB: October 08, 1995  Subjective:  HPI  Patient's mother states that she has noticed the past 2 days she has been moving slower and then today patient has been crying and states she just does not feel good. Patient denies any pain anywhere. Patient;s mom states she had slight cough, no vomiting no fever, no drainage from her ears.  Patient Active Problem List   Diagnosis Date Noted  . Active autistic disorder 01/07/2016  . Seizure (HCC) 04/28/2014    Past Medical History:  Diagnosis Date  . Autistic behavior   . Seizures [facility](HCC)     Social History   Social History  . Marital status: Single    Spouse name: N/A  . Number of children: N/A  . Years of education: N/A   Occupational History  . Not on file.   Social History Main Topics  . Smoking status: Never Smoker  . Smokeless tobacco: Never Used  . Alcohol use No  . Drug use: No  . Sexual activity: No   Other Topics Concern  . Not on file   Social History Narrative  . No narrative on file    Outpatient Encounter Prescriptions as of 10/27/2016  Medication Sig Note  . DIASTAT ACUDIAL 20 MG GEL  08/27/2016: Received from: External Pharmacy  . lamoTRIgine (LAMICTAL) 150 MG tablet Take 1 tablet by mouth daily. 06/02/2016: Received from: External Pharmacy  . levETIRAcetam (KEPPRA) 750 MG tablet Take 750 mg by mouth 2 (two) times daily.  [facility]07/2016: [facility]  . mometasone (NASONEX) 50 MCG/ACT nasal spray Place 2 sprays into the nose daily.   . norgestimate-ethinyl estradiol (ORTHO-CYCLEN,SPRINTEC,PREVIFEM) 0.25-35 MG-MCG tablet TAKE 1 TABLET BY MOUTH DAILY.    No facility-administered encounter medications on file as of 10/27/2016.     No Known Allergies  Review of Systems  Constitutional: Positive for malaise/fatigue.  Respiratory: Negative.   Cardiovascular: Negative.   Musculoskeletal: Negative.     Objective:  BP 114/82   Pulse 70   Temp 97.9  F (36.6 C)   Resp 14   LMP 08/11/2016   SpO2 94%   Physical Exam  Constitutional: She is well-developed, well-nourished, and in no distress.  HENT:  Head: Normocephalic and atraumatic.  Right Ear: External ear normal.  Left Ear: External ear normal.  Nose: Nose normal.  Mouth/Throat: Oropharynx is clear and moist. No oropharyngeal exudate.  TM on the left is full with purulent drainage. Split uvula  Eyes: Conjunctivae are normal. Pupils are equal, round, and reactive to light.  Neck: Normal range of motion. Neck supple. No thyromegaly present.  Cardiovascular: Normal rate, regular rhythm, normal heart sounds and intact distal pulses.   No murmur heard. Pulmonary/Chest: Effort normal and breath sounds normal. No respiratory distress. She has no wheezes.  Abdominal: Soft.  Lymphadenopathy:    She has no cervical adenopathy.  Neurological: She is alert.  Skin: Skin is warm and dry.    Assessment and Plan :  1. 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 Treat with Augmentin. Call if symptoms worsen. HPI, Exam and A&P transcribed under direction and in the presence of Julieanne Mansonichard Gilbert, MD.  I have done the exam and reviewed the chart and it is accurate to the best of my knowledge. DentistDragon  technology has been used and  any errors in dictation or transcription are unintentional. Julieanne Mansonichard Gilbert M.D. [facility]

## 2017-01-13 DIAGNOSIS — Z79899 Other long term (current) drug therapy: Secondary | ICD-10-CM | POA: Diagnosis not present

## 2017-01-13 DIAGNOSIS — R569 Unspecified convulsions: Secondary | ICD-10-CM | POA: Diagnosis not present

## 2017-04-29 ENCOUNTER — Ambulatory Visit (INDEPENDENT_AMBULATORY_CARE_PROVIDER_SITE_OTHER): Payer: 59 | Admitting: Family Medicine

## 2017-04-29 VITALS — BP 102/60 | HR 68 | Temp 97.2°F | Resp 16 | Wt 142.0 lb

## 2017-04-29 DIAGNOSIS — F840 Autistic disorder: Secondary | ICD-10-CM | POA: Diagnosis not present

## 2017-04-29 DIAGNOSIS — E785 Hyperlipidemia, unspecified: Secondary | ICD-10-CM | POA: Diagnosis not present

## 2017-04-29 NOTE — Progress Notes (Signed)
Grace Garcia  MRN: [id number] DOB: [birth year]  Subjective:  HPI   The patient is a 21 year old female who presents to have form completed for Daycare.  [facility]   She is here today with her mother and they report she is feeling well, sleeping well and is overall not having any problems.   She does need to have her Nasonex refilled today and it is of note that her Keppra has been decreased.  She has not had a Tonic Clonic Seizure in several months now.  She does however have Focal/Partial seizures on average once weekly.  She does ot have associated symptoms with any of the seizures other than urinary incontinence with the Tonic Clonic.  Her Tonic Clonic seizures are almost always illness induced.  Patient Active Problem List   Diagnosis Date Noted  . Active autistic disorder 01/07/2016  . Seizure (HCC) 04/28/2014    Past Medical History:  Diagnosis Date  . Autistic behavior   . Seizures [facility](HCC)     Social History   Social History  . Marital status: Single    Spouse name: N/A  . Number of children: N/A  . Years of education: N/A   Occupational History  . Not on file.   Social History Main Topics  . Smoking status: Never Smoker  . Smokeless tobacco: Never Used  . Alcohol use No  . Drug use: No  . Sexual activity: No   Other Topics Concern  . Not on file   Social History Narrative  . No narrative on file    Outpatient Encounter Prescriptions as of 04/29/2017  Medication Sig Note  . DIASTAT ACUDIAL 20 MG GEL  08/27/2016: Received from: External Pharmacy  . gabapentin (NEURONTIN) 300 MG capsule Take 300 mg by mouth 2 (two) times daily.   Marland Kitchen. lamoTRIgine (LAMICTAL) 150 MG tablet Take 1 tablet by mouth daily. 06/02/2016: Received from: External Pharmacy  . levETIRAcetam (KEPPRA) 750 MG tablet Take 375 mg by mouth 2 (two) times daily.  01/07/2016: [facility]  . mometasone (NASONEX)  50 MCG/ACT nasal spray Place 2 sprays into the nose daily.   . norgestimate-ethinyl estradiol (ORTHO-CYCLEN,SPRINTEC,PREVIFEM) 0.25-35 MG-MCG tablet TAKE 1 TABLET BY MOUTH DAILY.   . [DISCONTINUED] amoxicillin-clavulanate (AUGMENTIN) 875-125 MG tablet Take 1 tablet by mouth 2 (two) times daily.    No facility-administered encounter medications on file as of 04/29/2017.     No Known Allergies  Review of Systems  Constitutional: Negative for fever and malaise/fatigue.  Eyes: Negative.   Respiratory: Negative for cough, shortness of breath and wheezing.   Cardiovascular: Negative for chest pain, palpitations and leg swelling.  Neurological: Negative for weakness.  Endo/Heme/Allergies: Negative.   Psychiatric/Behavioral: Negative.     Objective:  BP 102/60 (BP Location: Right Arm, Patient Position: Sitting, Cuff Size: Normal)   Pulse 68   Temp (!) 97.2 F (36.2 C) (Oral)   Resp 16   Wt 142 lb (64.4 kg)   BMI 25.97 kg/m   Physical Exam  Constitutional: She is oriented to person, place, and time and well-developed, well-nourished, and in no distress.  HENT:  Head: Normocephalic.  Cardiovascular: Normal rate, regular rhythm and normal heart sounds.   Pulmonary/Chest: Effort normal and breath sounds normal.  Abdominal: Soft.  Neurological: She is alert and oriented to person, place, and time.  Skin: Skin is warm and dry.  Psychiatric: Mood and affect normal.  Assessment and Plan :  Seizures Autism Check labs for baseline.   I have done the exam and reviewed the chart and it is accurate to the best of my knowledge. DentistDragon  technology has been used and  any errors in dictation or transcription are unintentional. Julieanne Mansonichard Gilbert M.D. [facility]

## 2017-05-12 ENCOUNTER — Telehealth: Payer: Self-pay | Admitting: Family Medicine

## 2017-05-12 NOTE — Telephone Encounter (Signed)
Grace Garcia with [facility] called saying they received the entry form for this patient but the TB skin test part was not complete.  Can we find that out and give her a call back with the last date and result .  They would like this info asap.  She is there today for about an hour.  Their call back is [phone]  Thanks teri

## 2017-05-12 NOTE — Telephone Encounter (Signed)
This has been taking care of by Darcus AustinEmily R, CMA-aa

## 2017-05-18 ENCOUNTER — Telehealth: Payer: Self-pay

## 2017-05-18 DIAGNOSIS — Z111 Encounter for screening for respiratory tuberculosis: Secondary | ICD-10-CM

## 2017-05-19 ENCOUNTER — Other Ambulatory Visit: Payer: Self-pay | Admitting: Family Medicine

## 2017-05-19 DIAGNOSIS — Z111 Encounter for screening for respiratory tuberculosis: Secondary | ICD-10-CM | POA: Diagnosis not present

## 2017-05-19 LAB — TSH: TSH: 2.49 (ref 0.41–5.90)

## 2017-05-19 LAB — CBC AND DIFFERENTIAL
HCT: 37 (ref 36–46)
Hemoglobin: 12.3 (ref 12.0–16.0)
NEUTROS ABS: 4
PLATELETS: 269 (ref 150–399)
WBC: 6.3

## 2017-05-19 LAB — LIPID PANEL
Cholesterol: 175 (ref 0–200)
HDL: 58 (ref 35–70)
LDL Cholesterol: 91
TRIGLYCERIDES: 131 (ref 40–160)

## 2017-05-19 LAB — BASIC METABOLIC PANEL
BUN: 10 (ref 4–21)
Creatinine: 0.7 (ref 0.5–1.1)
GLUCOSE: 68
Potassium: 4.2 (ref 3.4–5.3)
Sodium: 141 (ref 137–147)

## 2017-05-19 NOTE — Telephone Encounter (Signed)
Patient needs to have TB testing

## 2017-05-23 LAB — QUANTIFERON IN TUBE
QUANTIFERON MITOGEN VALUE: 8.13 [IU]/mL
QUANTIFERON TB AG VALUE: 0.08 IU/mL
QUANTIFERON TB GOLD: NEGATIVE
Quantiferon Nil Value: 0.09 IU/mL

## 2017-05-23 LAB — COMPREHENSIVE METABOLIC PANEL
ALBUMIN: 4.2 g/dL (ref 3.5–5.5)
ALT: 8 IU/L (ref 0–32)
AST: 15 IU/L (ref 0–40)
Albumin/Globulin Ratio: 1.7 (ref 1.2–2.2)
Alkaline Phosphatase: 61 IU/L (ref 39–117)
BILIRUBIN TOTAL: 0.3 mg/dL (ref 0.0–1.2)
BUN/Creatinine Ratio: 15 (ref 9–23)
BUN: 10 mg/dL (ref 6–20)
CO2: 20 mmol/L (ref 20–29)
CREATININE: 0.66 mg/dL (ref 0.57–1.00)
Calcium: 8.9 mg/dL (ref 8.7–10.2)
Chloride: 106 mmol/L (ref 96–106)
GFR calc non Af Amer: 126 mL/min/{1.73_m2} (ref >59)
GFR, EST AFRICAN AMERICAN: 145 mL/min/{1.73_m2} (ref >59)
GLOBULIN, TOTAL: 2.5 g/dL (ref 1.5–4.5)
GLUCOSE: 68 mg/dL (ref 65–99)
Potassium: 4.2 mmol/L (ref 3.5–5.2)
Sodium: 141 mmol/L (ref 134–144)
TOTAL PROTEIN: 6.7 g/dL (ref 6.0–8.5)

## 2017-05-23 LAB — CBC WITH DIFFERENTIAL/PLATELET
BASOS ABS: 0 10*3/uL (ref 0.0–0.2)
Basos: 0 %
EOS (ABSOLUTE): 0.1 10*3/uL (ref 0.0–0.4)
EOS: 1 %
HEMATOCRIT: 36.6 % (ref 34.0–46.6)
HEMOGLOBIN: 12.3 g/dL (ref 11.1–15.9)
IMMATURE GRANS (ABS): 0 10*3/uL (ref 0.0–0.1)
Immature Granulocytes: 0 %
LYMPHS: 38 %
Lymphocytes Absolute: 2.4 10*3/uL (ref 0.7–3.1)
MCH: 29.6 pg (ref 26.6–33.0)
MCHC: 33.6 g/dL (ref 31.5–35.7)
MCV: 88 fL (ref 79–97)
MONOCYTES: 4 %
Monocytes Absolute: 0.3 10*3/uL (ref 0.1–0.9)
Neutrophils Absolute: 3.6 10*3/uL (ref 1.4–7.0)
Neutrophils: 57 %
Platelets: 269 10*3/uL (ref 150–379)
RBC: 4.15 x10E6/uL (ref 3.77–5.28)
RDW: 13.7 % (ref 12.3–15.4)
WBC: 6.3 10*3/uL (ref 3.4–10.8)

## 2017-05-23 LAB — QUANTIFERON TB GOLD ASSAY (BLOOD)

## 2017-05-23 LAB — LIPID PANEL W/O CHOL/HDL RATIO
Cholesterol, Total: 175 mg/dL (ref 100–199)
HDL: 58 mg/dL (ref >39)
LDL Calculated: 91 mg/dL (ref 0–99)
TRIGLYCERIDES: 131 mg/dL (ref 0–149)
VLDL CHOLESTEROL CAL: 26 mg/dL (ref 5–40)

## 2017-05-23 LAB — TSH: TSH: 2.49 u[IU]/mL (ref 0.450–4.500)

## 2017-06-05 ENCOUNTER — Ambulatory Visit (INDEPENDENT_AMBULATORY_CARE_PROVIDER_SITE_OTHER): Payer: 59

## 2017-06-05 DIAGNOSIS — Z23 Encounter for immunization: Secondary | ICD-10-CM | POA: Diagnosis not present

## 2017-06-29 ENCOUNTER — Other Ambulatory Visit: Payer: Self-pay

## 2017-06-29 MED ORDER — MOMETASONE FUROATE 50 MCG/ACT NA SUSP: 2.0000 | Freq: Every day | NASAL | 12 refills | Status: DC

## 2017-06-29 NOTE — Telephone Encounter (Signed)
Mother requesting refill of Nasonex.

## 2017-07-02 ENCOUNTER — Telehealth: Payer: Self-pay | Admitting: Family Medicine

## 2017-07-02 NOTE — Telephone Encounter (Addendum)
[facility] faxed to [facility] for records. Received records from [facility] forward 25 pages to Dr. Sullivan Lone.

## 2017-07-15 DIAGNOSIS — R569 Unspecified convulsions: Secondary | ICD-10-CM | POA: Diagnosis not present

## 2017-08-03 ENCOUNTER — Other Ambulatory Visit: Payer: Self-pay | Admitting: Family Medicine

## 2017-09-27 ENCOUNTER — Telehealth: Payer: Self-pay

## 2017-09-27 MED ORDER — CIPROFLOXACIN-DEXAMETHASONE 0.3-0.1 % OT SUSP: 4.0000 [drp] | Freq: Two times a day (BID) | OTIC | 1 refills | Status: DC

## 2017-09-27 NOTE — Telephone Encounter (Signed)
Patient's mom states that patient's ears started to drain over the weekend which is an indication for her that she has ear infection. No fever or pain just draining a lot. They have Ciprodex otic suspension on hand left over but not enough for the whole coarse. Mom asking if this can be filled please to have? Please Jonelle Sportsadvise-Anastasiya V Hopkins, RMA

## 2017-09-27 NOTE — Telephone Encounter (Signed)
Ok to rf? 

## 2017-09-27 NOTE — Telephone Encounter (Signed)
RX sent in and pt advised-Anastasiya V Hopkins, RMA  

## 2018-01-13 DIAGNOSIS — R569 Unspecified convulsions: Secondary | ICD-10-CM | POA: Diagnosis not present

## 2018-04-25 ENCOUNTER — Ambulatory Visit (INDEPENDENT_AMBULATORY_CARE_PROVIDER_SITE_OTHER): Payer: 59 | Admitting: Family Medicine

## 2018-04-25 ENCOUNTER — Encounter: Payer: Self-pay | Admitting: Family Medicine

## 2018-04-25 VITALS — BP 122/68 | HR 88 | Temp 97.9°F | Resp 16 | Wt 135.0 lb

## 2018-04-25 DIAGNOSIS — F840 Autistic disorder: Secondary | ICD-10-CM

## 2018-04-25 DIAGNOSIS — R569 Unspecified convulsions: Secondary | ICD-10-CM | POA: Diagnosis not present

## 2018-04-25 NOTE — Progress Notes (Signed)
Patient: Grace Garcia Female    DOB: 1996/02/04   22 y.o.   MRN: [id number] Visit Date: 04/25/2018  Today's Provider: Megan Mans, MD   Chief Complaint  Patient presents with  . needs form filled out   Subjective:    HPI  Patient comes in today accompanied with her mother to have forms completed to participate in [employer]. She feels well today with no other complaints.  Her Mother has no concerns.    No Known Allergies   Current Outpatient Medications:  .  ciprofloxacin-dexamethasone (CIPRODEX) OTIC suspension, Place 4 drops into both ears 2 (two) times daily., Disp: 7.5 mL, Rfl: 1 .  DIASTAT ACUDIAL 20 MG GEL, , Disp: , Rfl: 0 .  gabapentin (NEURONTIN) 300 MG capsule, Take 300 mg by mouth 2 (two) times daily., Disp: , Rfl:  .  lamoTRIgine (LAMICTAL) 150 MG tablet, Take 1 tablet by mouth daily., Disp: , Rfl: 3 .  levETIRAcetam (KEPPRA) 750 MG tablet, Take 375 mg by mouth 2 (two) times daily. , Disp: , Rfl:  .  mometasone (NASONEX) 50 MCG/ACT nasal spray, Place 2 sprays into the nose daily., Disp: 17 g, Rfl: 12 .  norgestimate-ethinyl estradiol (ORTHO-CYCLEN,SPRINTEC,PREVIFEM) 0.25-35 MG-MCG tablet, TAKE 1 TABLET BY MOUTH DAILY, Disp: 84 tablet, Rfl: 11  Review of Systems  Constitutional: Negative for activity change, appetite change, chills, diaphoresis, fatigue, fever and unexpected weight change.  Respiratory: Negative for cough and shortness of breath.   Cardiovascular: Negative for chest pain, palpitations and leg swelling.  Gastrointestinal: Negative for abdominal pain, constipation, diarrhea and nausea.  Genitourinary: Negative for difficulty urinating, frequency and menstrual problem.  Musculoskeletal: Negative for arthralgias, back pain, gait problem, joint swelling, myalgias, neck pain and neck stiffness.  Allergic/Immunologic: Negative for environmental allergies.  Neurological: Negative for dizziness, light-headedness and headaches.    Psychiatric/Behavioral: Negative for agitation, decreased concentration, dysphoric mood, hallucinations and sleep disturbance. The patient is not nervous/anxious.     Social History   Tobacco Use  . Smoking status: Never Smoker  . Smokeless tobacco: Never Used  Substance Use Topics  . Alcohol use: No   Objective:   BP 122/68 (BP Location: Right Arm, Patient Position: Sitting, Cuff Size: Normal)   Pulse 88   Temp 97.9 F (36.6 C)   Resp 16   Wt 135 lb (61.2 kg)   SpO2 99%   BMI 24.69 kg/m  Vitals:   04/25/18 0943  BP: 122/68  Pulse: 88  Resp: 16  Temp: 97.9 F (36.6 C)  SpO2: 99%  Weight: 135 lb (61.2 kg)     Physical Exam  Constitutional: She appears well-developed and well-nourished.  HENT:  Head: Normocephalic and atraumatic.  Right Ear: External ear normal.  Left Ear: External ear normal.  Nose: Nose normal.  Mouth/Throat: Oropharynx is clear and moist.  Uvula with wide split.  Eyes: Pupils are equal, round, and reactive to light. Conjunctivae are normal. No scleral icterus.  Neck: No thyromegaly present.  Cardiovascular: Normal rate, regular rhythm and normal heart sounds.  Pulmonary/Chest: Effort normal and breath sounds normal.  Abdominal: Soft.  Musculoskeletal: She exhibits no edema.  Lymphadenopathy:    She has no cervical adenopathy.  Neurological: She is alert.  Skin: Skin is warm and dry.  Psychiatric: She has a normal mood and affect.  Autistic. Yes/no are only verbal responses that I get.        Assessment & Plan:  Autism Seizure Disorder Stable. Cleared for [employer].      I have done the exam and reviewed the above chart and it is accurate to the best of my knowledge. DentistDragon  technology has been used in this note in any air is in the dictation or transcription are unintentional.  Megan Mansichard Horace Wishon Jr, MD  [facility]

## 2018-07-08 ENCOUNTER — Other Ambulatory Visit: Payer: Self-pay | Admitting: Family Medicine

## 2018-07-16 ENCOUNTER — Ambulatory Visit (INDEPENDENT_AMBULATORY_CARE_PROVIDER_SITE_OTHER): Payer: 59

## 2018-07-16 DIAGNOSIS — Z23 Encounter for immunization: Secondary | ICD-10-CM | POA: Diagnosis not present

## 2018-07-22 ENCOUNTER — Encounter: Payer: Self-pay | Admitting: Family Medicine

## 2018-07-22 ENCOUNTER — Ambulatory Visit (INDEPENDENT_AMBULATORY_CARE_PROVIDER_SITE_OTHER): Payer: 59 | Admitting: Family Medicine

## 2018-07-22 VITALS — BP 125/81 | HR 78 | Temp 97.9°F | Wt 138.6 lb

## 2018-07-22 DIAGNOSIS — H5789 Other specified disorders of eye and adnexa: Secondary | ICD-10-CM

## 2018-07-22 MED ORDER — ERYTHROMYCIN 5 MG/GM OP OINT: 1.0000 "application " | TOPICAL_OINTMENT | Freq: Two times a day (BID) | OPHTHALMIC | 0 refills | Status: DC

## 2018-07-22 NOTE — Addendum Note (Signed)
Addended by: Dortha Kern E on: 07/22/2018 11:32 AM   Modules accepted: Orders

## 2018-07-22 NOTE — Progress Notes (Signed)
Patient: Grace Garcia Female    DOB: 05/04/1996   22 y.o.   MRN: [id number] Visit Date: 07/22/2018  Today's Provider: Dortha Kern, PA   Chief Complaint  Patient presents with  . Eye Problem   Subjective:    HPI Eye Pain   Patient presents today for left eye irritation. Patient father states that the eye has a little redness. Irritation started after eating a piece of toast this morning. No drainage from either eye.   Past Medical History:  Diagnosis Date  . Autistic behavior   . Seizures (HCC)    Past Surgical History:  Procedure Laterality Date  . ASD REPAIR  2002  . CARDIAC CATHETERIZATION     to repair heart defect with mesh  . MYRINGOTOMY WITH TUBE PLACEMENT     Family History  Problem Relation Age of Onset  . ADD / ADHD Brother   . Heart disease Paternal Grandfather   . Diabetes Maternal Grandfather   . Hypertension Maternal Grandfather    No Known Allergies  Current Outpatient Medications:  .  ciprofloxacin-dexamethasone (CIPRODEX) OTIC suspension, Place 4 drops into both ears 2 (two) times daily., Disp: 7.5 mL, Rfl: 1 .  DIASTAT ACUDIAL 20 MG GEL, , Disp: , Rfl: 0 .  gabapentin (NEURONTIN) 300 MG capsule, Take 300 mg by mouth 2 (two) times daily., Disp: , Rfl:  .  lamoTRIgine (LAMICTAL) 150 MG tablet, Take 1 tablet by mouth daily., Disp: , Rfl: 3 .  levETIRAcetam (KEPPRA) 750 MG tablet, Take 375 mg by mouth 2 (two) times daily. , Disp: , Rfl:  .  mometasone (NASONEX) 50 MCG/ACT nasal spray, PLACE 2 SPRAYS INTO THE NOSE DAILY., Disp: 17 g, Rfl: 12 .  norgestimate-ethinyl estradiol (ORTHO-CYCLEN,SPRINTEC,PREVIFEM) 0.25-35 MG-MCG tablet, TAKE 1 TABLET BY MOUTH DAILY, Disp: 84 tablet, Rfl: 11  Review of Systems  Constitutional: Negative.   HENT: Negative.   Eyes: Positive for redness.  Respiratory: Negative.   Gastrointestinal: Negative.   Genitourinary: Negative.   Allergic/Immunologic: Negative.   Neurological: Negative.     Psychiatric/Behavioral: Negative.    Social History   Tobacco Use  . Smoking status: Never Smoker  . Smokeless tobacco: Never Used  Substance Use Topics  . Alcohol use: No   Objective:   BP 125/81 (BP Location: Right Arm, Patient Position: Sitting, Cuff Size: Normal)   Pulse 78   Temp 97.9 F (36.6 C) (Oral)   Wt 138 lb 9.6 oz (62.9 kg)   SpO2 99%   BMI 25.35 kg/m  Vitals:   07/22/18 0952  BP: 125/81  Pulse: 78  Temp: 97.9 F (36.6 C)  TempSrc: Oral  SpO2: 99%  Weight: 138 lb 9.6 oz (62.9 kg)   Physical Exam  Constitutional: She appears well-developed and well-nourished.  HENT:  Head: Normocephalic.  Right Ear: External ear normal.  Left Ear: External ear normal.  Nose: Nose normal.  Mouth/Throat: Oropharynx is clear and moist.  Double uvula.  Eyes: Right eye exhibits no discharge. Left eye exhibits no discharge. No scleral icterus.  Right eye with normal pupil reaction and full EOM. No conjunctiva irritation on the right. Unable to cooperate with full exam of the left eye. Conjunctiva with minimal redness. Not able to visually examine pupil or cornea.  Neck: Neck supple.      Assessment & Plan:     1. Irritation of left eye Onset this morning after eating some toast. Autism severely limits ability to examine the eye.  Finally allowed some irrigation with saline solution and opened the eye in a dark room. Suspect foreign body effect possibly from a crumb of toast. Will ask mother to apply Erythromycin Ophthalmic Ointment inside the lower eyelid twice a day for the next 2 days. If no better in 2 days, should go to ophthalmologist for more extensive exam.       Dortha Kern, PA  [facility]

## 2018-09-20 ENCOUNTER — Encounter: Payer: Self-pay | Admitting: Physician Assistant

## 2018-09-20 ENCOUNTER — Ambulatory Visit (INDEPENDENT_AMBULATORY_CARE_PROVIDER_SITE_OTHER): Payer: 59 | Admitting: Physician Assistant

## 2018-09-20 VITALS — BP 114/75 | HR 83 | Temp 97.7°F | Resp 16 | Wt 145.0 lb

## 2018-09-20 DIAGNOSIS — R569 Unspecified convulsions: Secondary | ICD-10-CM

## 2018-09-20 DIAGNOSIS — W19XXXA Unspecified fall, initial encounter: Secondary | ICD-10-CM | POA: Diagnosis not present

## 2018-09-20 NOTE — Progress Notes (Signed)
Patient: Grace Garcia Female    DOB: 06-May-1996   22 y.o.   MRN: [id number] Visit Date: 09/20/2018  Today's Provider: Margaretann LovelessJennifer M Burnette, PA-C   Chief Complaint  Patient presents with  . Fall  . Seizures   Subjective:     HPI Patient here today with a "knot" on her head due to a fall last night. Patient's mother Vernona Rieger(Laura) reports that patient was in the kitchen last night around 11 pm possibly getting a drink. Patient was alone before anyone heard her fall and when she was found patient was having a seizure which may have lasted about 3 minutes. Patient did well during the night per mom. Patient slept well no vomiting or any abnormal symptoms per mom. Vernona RiegerLaura reports patient has been taking her medications as prescribed and until last night patients last seizure was about one year ago. Per Vernona RiegerLaura patient woke up this morning feeling well, ate breakfast and denies any problems.   No Known Allergies   Current Outpatient Medications:  .  ciprofloxacin-dexamethasone (CIPRODEX) OTIC suspension, Place 4 drops into both ears 2 (two) times daily., Disp: 7.5 mL, Rfl: 1 .  DIASTAT ACUDIAL 20 MG GEL, , Disp: , Rfl: 0 .  erythromycin ophthalmic ointment, Place 1 application into the left eye 2 (two) times daily. Use 1/2 inch ribbon inside the lower lid., Disp: 3.5 g, Rfl: 0 .  gabapentin (NEURONTIN) 300 MG capsule, Take 300 mg by mouth 2 (two) times daily., Disp: , Rfl:  .  lamoTRIgine (LAMICTAL) 150 MG tablet, Take 1 tablet by mouth daily., Disp: , Rfl: 3 .  levETIRAcetam (KEPPRA) 750 MG tablet, Take 375 mg by mouth 2 (two) times daily. , Disp: , Rfl:  .  mometasone (NASONEX) 50 MCG/ACT nasal spray, PLACE 2 SPRAYS INTO THE NOSE DAILY., Disp: 17 g, Rfl: 12 .  norgestimate-ethinyl estradiol (ORTHO-CYCLEN,SPRINTEC,PREVIFEM) 0.25-35 MG-MCG tablet, TAKE 1 TABLET BY MOUTH DAILY, Disp: 84 tablet, Rfl: 11  Review of Systems  Constitutional: Negative.   HENT: Negative.   Respiratory:  Negative.   Cardiovascular: Negative.   Neurological: Positive for seizures. Negative for dizziness, syncope, weakness and headaches.  Psychiatric/Behavioral: Negative.     Social History   Tobacco Use  . Smoking status: Never Smoker  . Smokeless tobacco: Never Used  Substance Use Topics  . Alcohol use: No      Objective:   BP 114/75 (BP Location: Left Arm, Patient Position: Sitting, Cuff Size: Normal)   Pulse 83   Temp 97.7 F (36.5 C) (Oral)   Resp 16   Wt 145 lb (65.8 kg)   SpO2 99%   BMI 26.52 kg/m  Vitals:   09/20/18 1050  BP: 114/75  Pulse: 83  Resp: 16  Temp: 97.7 F (36.5 C)  TempSrc: Oral  SpO2: 99%  Weight: 145 lb (65.8 kg)     Physical Exam Vitals signs reviewed.  Constitutional:      General: She is not in acute distress.    Appearance: She is well-developed. She is not diaphoretic.  HENT:     Head: Normocephalic. Contusion present.      Right Ear: Hearing, ear canal and external ear normal. A PE tube is present.     Left Ear: Hearing, ear canal and external ear normal. A PE tube is present.  Eyes:     Extraocular Movements: Extraocular movements intact.     Conjunctiva/sclera: Conjunctivae normal.     Pupils: Pupils are equal, round,  and reactive to light.  Neck:     Musculoskeletal: Normal range of motion and neck supple.     Thyroid: No thyromegaly.     Vascular: No JVD.     Trachea: No tracheal deviation.  Cardiovascular:     Rate and Rhythm: Normal rate and regular rhythm.     Heart sounds: Normal heart sounds. No murmur. No friction rub. No gallop.   Pulmonary:     Effort: Pulmonary effort is normal. No respiratory distress.     Breath sounds: Normal breath sounds. No wheezing or rales.  Lymphadenopathy:     Cervical: No cervical adenopathy.  Neurological:     General: No focal deficit present.     Mental Status: She is alert and oriented to person, place, and time.     Cranial Nerves: Cranial nerves are intact.     Sensory:  Sensation is intact.     Motor: Motor function is intact.     Coordination: Coordination is intact. Romberg sign negative. Finger-Nose-Finger Test normal.         Assessment & Plan    1. Fall, initial encounter Fall secondary to seizure where she hit her head during fall. Currently there are no neuro deficits. Patient denies headaches or visual changes. No nausea or vomiting. Small contusion noted on posterior scalp that has already decreased in size and is not painful to the touch. Do not suspect concussion. Advised to monitor for 48 hours and return precautions and emergent situations discussed. They voiced understanding. Advised to call neurologist to update on seizure since it has been a while since she had one. They agree.   2. Seizure (HCC) See above medical treatment plan.     Margaretann LovelessJennifer M Burnette, PA-C  [facility]

## 2018-09-26 ENCOUNTER — Other Ambulatory Visit: Payer: Self-pay | Admitting: Family Medicine

## 2018-09-30 NOTE — Telephone Encounter (Signed)
Please review for Dr. Gilbert  

## 2018-10-04 DIAGNOSIS — R569 Unspecified convulsions: Secondary | ICD-10-CM | POA: Diagnosis not present

## 2018-10-04 DIAGNOSIS — Z79899 Other long term (current) drug therapy: Secondary | ICD-10-CM | POA: Diagnosis not present

## 2018-10-07 DIAGNOSIS — R569 Unspecified convulsions: Secondary | ICD-10-CM | POA: Diagnosis not present

## 2018-10-07 DIAGNOSIS — Z79899 Other long term (current) drug therapy: Secondary | ICD-10-CM | POA: Diagnosis not present

## 2018-10-16 DIAGNOSIS — Z23 Encounter for immunization: Secondary | ICD-10-CM | POA: Diagnosis not present

## 2018-10-16 DIAGNOSIS — G40909 Epilepsy, unspecified, not intractable, without status epilepticus: Secondary | ICD-10-CM | POA: Diagnosis not present

## 2018-10-16 DIAGNOSIS — S0101XA Laceration without foreign body of scalp, initial encounter: Secondary | ICD-10-CM | POA: Diagnosis not present

## 2018-10-16 DIAGNOSIS — S0990XA Unspecified injury of head, initial encounter: Secondary | ICD-10-CM | POA: Diagnosis not present

## 2018-10-16 DIAGNOSIS — G40919 Epilepsy, unspecified, intractable, without status epilepticus: Secondary | ICD-10-CM | POA: Diagnosis not present

## 2018-10-16 DIAGNOSIS — K090 Developmental odontogenic cysts: Secondary | ICD-10-CM | POA: Diagnosis not present

## 2018-10-16 DIAGNOSIS — R569 Unspecified convulsions: Secondary | ICD-10-CM | POA: Diagnosis not present

## 2018-10-16 DIAGNOSIS — S0993XA Unspecified injury of face, initial encounter: Secondary | ICD-10-CM | POA: Diagnosis not present

## 2018-10-21 ENCOUNTER — Encounter: Payer: Self-pay | Admitting: Physician Assistant

## 2018-10-26 ENCOUNTER — Encounter: Payer: Self-pay | Admitting: Family Medicine

## 2018-10-26 ENCOUNTER — Ambulatory Visit (INDEPENDENT_AMBULATORY_CARE_PROVIDER_SITE_OTHER): Payer: 59 | Admitting: Family Medicine

## 2018-10-26 VITALS — BP 118/64 | HR 84 | Temp 98.3°F | Ht 63.0 in | Wt 143.2 lb

## 2018-10-26 DIAGNOSIS — S0101XA Laceration without foreign body of scalp, initial encounter: Secondary | ICD-10-CM

## 2018-10-26 DIAGNOSIS — G40909 Epilepsy, unspecified, not intractable, without status epilepticus: Secondary | ICD-10-CM | POA: Diagnosis not present

## 2018-10-26 NOTE — Progress Notes (Signed)
Patient: Grace Garcia Female    DOB: [birth year]   23 y.o.   MRN: [id number] Visit Date: 10/26/2018  Today's Provider: Megan Mans, MD   Chief Complaint  Patient presents with  . staples removed   Subjective:     HPI  Patient comes in today to have her staples removed. She had a seizure at [employer] while on vacation at the beach, and she had 7 staples placed in the back of her head.   No Known Allergies   Current Outpatient Medications:  .  DIASTAT ACUDIAL 20 MG GEL, , Disp: , Rfl: 0 .  gabapentin (NEURONTIN) 300 MG capsule, Take 300 mg by mouth 2 (two) times daily., Disp: , Rfl:  .  lamoTRIgine (LAMICTAL) 150 MG tablet, Take 1 tablet by mouth 2 (two) times daily. Takes 150mg  in the morning and 200mg  at bedtime., Disp: , Rfl: 3 .  mometasone (NASONEX) 50 MCG/ACT nasal spray, PLACE 2 SPRAYS INTO THE NOSE DAILY., Disp: 17 g, Rfl: 12 .  MONO-LINYAH 0.25-35 MG-MCG tablet, TAKE 1 TABLET BY MOUTH DAILY, Disp: 84 tablet, Rfl: 11 .  ciprofloxacin-dexamethasone (CIPRODEX) OTIC suspension, Place 4 drops into both ears 2 (two) times daily. (Patient not taking: Reported on 09/20/2018), Disp: 7.5 mL, Rfl: 1  Review of Systems  Constitutional: Negative for activity change, appetite change, chills, diaphoresis and fatigue.  Eyes: Negative.   Respiratory: Negative.   Cardiovascular: Negative.   Endocrine: Negative.   Skin: Negative for color change, pallor, rash and wound.  Neurological: Negative for dizziness, light-headedness and headaches.  Psychiatric/Behavioral: Negative for agitation and self-injury.    Social History   Tobacco Use  . Smoking status: Never Smoker  . Smokeless tobacco: Never Used  Substance Use Topics  . Alcohol use: No      Objective:   BP 118/64 (BP Location: Left Arm, Patient Position: Sitting, Cuff Size: Normal)   Pulse 84   Temp 98.3 F (36.8 C)   Ht 5\' 3"  (1.6 m)   Wt 143 lb 3.2 oz (65 kg)   SpO2 99%   BMI 25.37 kg/m  Vitals:   10/26/18 1004  BP: 118/64  Pulse: 84  Temp: 98.3 F (36.8 C)  SpO2: 99%  Weight: 143 lb 3.2 oz (65 kg)  Height: 5\' 3"  (1.6 m)     Physical Exam Constitutional:      Appearance: She is normal weight.  HENT:     Head:     Comments: Well-healed laceration right upper head. 7 Staples are removed without difficulty.    Right Ear: External ear normal.     Left Ear: External ear normal.  Eyes:     General: No scleral icterus.    Conjunctiva/sclera: Conjunctivae normal.  Cardiovascular:     Rate and Rhythm: Normal rate and regular rhythm.  Pulmonary:     Effort: Pulmonary effort is normal.     Breath sounds: Normal breath sounds.  Neurological:     General: No focal deficit present.     Mental Status: She is alert.  Psychiatric:        Behavior: Behavior normal.         Assessment & Plan    1. Laceration of scalp without foreign body, initial encounter Staples removed.  2. Seizure disorder (HCC)     I have done the exam and reviewed the above chart and it is accurate to the best of my knowledge. Dentist has been used  in this note in any air is in the dictation or transcription are unintentional.  Wilhemena Durie, MD  [facility]

## 2018-11-04 DIAGNOSIS — R569 Unspecified convulsions: Secondary | ICD-10-CM | POA: Diagnosis not present

## 2018-11-04 DIAGNOSIS — Z79899 Other long term (current) drug therapy: Secondary | ICD-10-CM | POA: Diagnosis not present

## 2018-11-09 ENCOUNTER — Ambulatory Visit (INDEPENDENT_AMBULATORY_CARE_PROVIDER_SITE_OTHER): Payer: 59 | Admitting: Family Medicine

## 2018-11-09 VITALS — BP 104/70 | HR 79 | Temp 98.3°F | Resp 16 | Wt 144.0 lb

## 2018-11-09 DIAGNOSIS — F840 Autistic disorder: Secondary | ICD-10-CM

## 2018-11-09 DIAGNOSIS — H6501 Acute serous otitis media, right ear: Secondary | ICD-10-CM | POA: Diagnosis not present

## 2018-11-09 DIAGNOSIS — Z79899 Other long term (current) drug therapy: Secondary | ICD-10-CM

## 2018-11-09 LAB — POCT URINE PREGNANCY: Preg Test, Ur: NEGATIVE

## 2018-11-09 MED ORDER — AMOXICILLIN 500 MG PO CAPS: 1000.0000 mg | ORAL_CAPSULE | Freq: Two times a day (BID) | ORAL | 2 refills | Status: DC

## 2018-11-09 NOTE — Progress Notes (Signed)
Grace Garcia  MRN: [id number] DOB: 1995-12-29  Subjective:  HPI   The patient is a 23 year old female who presents for evaluation of ear infection.  Her mother states that she has not complained of pain but has had drainage from the right ear.  She does have tubes in place.   The mother states that her neurologist would like for [place] to check a pregnancy test on the patient because she has had her Lamictal increased and the level continues to drop.    No fever or flu symptoms.  No cough.  Patient Active Problem List   Diagnosis Date Noted  . Active autistic disorder 01/07/2016  . Seizure (HCC) 04/28/2014    Past Medical History:  Diagnosis Date  . Autistic behavior   . Seizures (HCC)     Social History   Socioeconomic History  . Marital status: Single    Spouse name: Not on file  . Number of children: Not on file  . Years of education: Not on file  . Highest education level: Not on file  Occupational History  . Not on file  Social Needs  . Financial resource strain: Not on file  . Food insecurity:    Worry: Not on file    Inability: Not on file  . Transportation needs:    Medical: Not on file    Non-medical: Not on file  Tobacco Use  . Smoking status: Never Smoker  . Smokeless tobacco: Never Used  Substance and Sexual Activity  . Alcohol use: No  . Drug use: No  . Sexual activity: Never  Lifestyle  . Physical activity:    Days per week: Not on file    Minutes per session: Not on file  . Stress: Not on file  Relationships  . Social connections:    Talks on phone: Not on file    Gets together: Not on file    Attends religious service: Not on file    Active member of club or organization: Not on file    Attends meetings of clubs or organizations: Not on file    Relationship status: Not on file  . Intimate partner violence:    Fear of current or ex partner: Not on file    Emotionally abused: Not on file    Physically abused: Not on file    Forced  sexual activity: Not on file  Other Topics Concern  . Not on file  Social History Narrative  . Not on file    Outpatient Encounter Medications as of 11/09/2018  Medication Sig Note  . DIASTAT ACUDIAL 20 MG GEL  08/27/2016: Received from: External Pharmacy  . gabapentin (NEURONTIN) 300 MG capsule Take 300 mg by mouth 2 (two) times daily.   Marland Kitchen lamoTRIgine (LAMICTAL) 150 MG tablet Take 1 tablet by mouth 2 (two) times daily. Takes 150mg  in the morning and 200mg  at bedtime. 06/02/2016: Received from: External Pharmacy  . mometasone (NASONEX) 50 MCG/ACT nasal spray PLACE 2 SPRAYS INTO THE NOSE DAILY.   Marland Kitchen MONO-LINYAH 0.25-35 MG-MCG tablet TAKE 1 TABLET BY MOUTH DAILY   . [DISCONTINUED] ciprofloxacin-dexamethasone (CIPRODEX) OTIC suspension Place 4 drops into both ears 2 (two) times daily. (Patient not taking: Reported on 09/20/2018)    No facility-administered encounter medications on file as of 11/09/2018.     No Known Allergies  Review of Systems  Constitutional: Negative for fever.  HENT: Positive for ear discharge. Negative for congestion, ear pain, sinus pain and sore throat.  Eyes: Positive for discharge.  Respiratory: Negative for cough, shortness of breath and wheezing.   Cardiovascular: Negative.   Gastrointestinal: Negative.   Neurological: Positive for seizures.    Objective:  BP 104/70 (BP Location: Right Arm, Patient Position: Sitting, Cuff Size: Normal)   Pulse 79   Temp 98.3 F (36.8 C) (Oral)   Resp 16   Wt 144 lb (65.3 kg)   BMI 25.51 kg/m   Physical Exam  Constitutional: She is well-developed, well-nourished, and in no distress.  HENT:  Head: Normocephalic.  Right Ear: External ear normal.  Left Ear: External ear normal.  Left TM is normal tube is in place.  Right TM has a tube in place but has a good bit of fluid behind it and pus draining from the tube.    Assessment and Plan :  1. Right acute serous otitis media, recurrence not specified Follow-up PRN.   This is discussed with mother. - amoxicillin (AMOXIL) 500 MG capsule; Take 2 capsules (1,000 mg total) by mouth 2 (two) times daily.  Dispense: 28 capsule; Refill: 2  2. High risk medication use  - POCT urine pregnancy 3.Autism  I have done the exam and reviewed the chart and it is accurate to the best of my knowledge. DentistDragon  technology has been used and  any errors in dictation or transcription are unintentional. Julieanne Mansonichard Sharda Keddy M.D. [facility]

## 2018-11-24 DIAGNOSIS — F840 Autistic disorder: Secondary | ICD-10-CM | POA: Diagnosis not present

## 2018-11-24 DIAGNOSIS — R569 Unspecified convulsions: Secondary | ICD-10-CM | POA: Diagnosis not present

## 2018-11-29 DIAGNOSIS — F840 Autistic disorder: Secondary | ICD-10-CM | POA: Diagnosis not present

## 2018-11-29 DIAGNOSIS — R569 Unspecified convulsions: Secondary | ICD-10-CM | POA: Diagnosis not present

## 2019-04-07 ENCOUNTER — Telehealth: Payer: Self-pay | Admitting: Family Medicine

## 2019-04-07 MED ORDER — CIPRODEX 0.3-0.1 % OT SUSP: 4.0000 [drp] | Freq: Two times a day (BID) | OTIC | 2 refills | Status: DC

## 2019-04-07 NOTE — Telephone Encounter (Signed)
Patient mother request refill due to previous prescription expiring.

## 2019-04-24 DIAGNOSIS — R569 Unspecified convulsions: Secondary | ICD-10-CM | POA: Diagnosis not present

## 2019-05-31 ENCOUNTER — Telehealth: Payer: Self-pay

## 2019-05-31 NOTE — Telephone Encounter (Signed)
LMTCB Covid screening  

## 2019-06-01 ENCOUNTER — Other Ambulatory Visit: Payer: Self-pay

## 2019-06-01 ENCOUNTER — Ambulatory Visit (INDEPENDENT_AMBULATORY_CARE_PROVIDER_SITE_OTHER): Payer: 59 | Admitting: Family Medicine

## 2019-06-01 ENCOUNTER — Encounter: Payer: Self-pay | Admitting: Family Medicine

## 2019-06-01 VITALS — BP 112/64 | Temp 97.1°F | Resp 18 | Wt 146.4 lb

## 2019-06-01 DIAGNOSIS — Z23 Encounter for immunization: Secondary | ICD-10-CM | POA: Diagnosis not present

## 2019-06-01 DIAGNOSIS — D649 Anemia, unspecified: Secondary | ICD-10-CM

## 2019-06-01 DIAGNOSIS — F840 Autistic disorder: Secondary | ICD-10-CM

## 2019-06-01 DIAGNOSIS — G40909 Epilepsy, unspecified, not intractable, without status epilepticus: Secondary | ICD-10-CM

## 2019-06-01 NOTE — Progress Notes (Signed)
Patient: Grace Garcia Female    DOB: [birth year]   23 y.o.   MRN: [id number] Visit Date: 06/01/2019  Today's Provider: Wilhemena Durie, MD   No chief complaint on file.  Subjective:     HPI Fill out form for daycare. She has seizures and autis. She does participate in [employer] and likes basketball and bowling. She is doing fairly well at home due to the pandemic. No Known Allergies   Current Outpatient Medications:  .  ciprofloxacin-dexamethasone (CIPRODEX) OTIC suspension, Place 4 drops into both ears 2 (two) times daily., Disp: 7.5 mL, Rfl: 2 .  DIASTAT ACUDIAL 20 MG GEL, , Disp: , Rfl: 0 .  gabapentin (NEURONTIN) 300 MG capsule, Take 300 mg by mouth 2 (two) times daily., Disp: , Rfl:  .  lamoTRIgine (LAMICTAL) 150 MG tablet, Take 1 tablet by mouth 2 (two) times daily. Takes 200 mg twice a day, Disp: , Rfl: 3 .  mometasone (NASONEX) 50 MCG/ACT nasal spray, PLACE 2 SPRAYS INTO THE NOSE DAILY., Disp: 17 g, Rfl: 12 .  MONO-LINYAH 0.25-35 MG-MCG tablet, TAKE 1 TABLET BY MOUTH DAILY, Disp: 84 tablet, Rfl: 11 .  amoxicillin (AMOXIL) 500 MG capsule, Take 2 capsules (1,000 mg total) by mouth 2 (two) times daily. (Patient not taking: Reported on 06/01/2019), Disp: 28 capsule, Rfl: 2  Review of Systems  Social History   Tobacco Use  . Smoking status: Never Smoker  . Smokeless tobacco: Never Used  Substance Use Topics  . Alcohol use: No      Objective:   BP 112/64 (BP Location: Left Arm, Patient Position: Sitting, Cuff Size: Normal)   Temp (!) 97.1 F (36.2 C) (Temporal)   Resp 18   Wt 146 lb 6.4 oz (66.4 kg)   SpO2 99%   BMI 25.93 kg/m  Vitals:   06/01/19 1600  BP: 112/64  Resp: 18  Temp: (!) 97.1 F (36.2 C)  TempSrc: Temporal  SpO2: 99%  Weight: 146 lb 6.4 oz (66.4 kg)  Body mass index is 25.93 kg/m.   Physical Exam HENT:     Head: Normocephalic and atraumatic.     Right Ear: Tympanic membrane and external ear normal.     Left Ear:  Tympanic membrane and external ear normal.     Ears:     Comments: Bilateral tubes in place. Eyes:     General: No scleral icterus. Cardiovascular:     Rate and Rhythm: Normal rate and regular rhythm.     Pulses: Normal pulses.     Heart sounds: Normal heart sounds.  Pulmonary:     Breath sounds: Normal breath sounds.  Abdominal:     Palpations: Abdomen is soft.  Musculoskeletal:     Right lower leg: No edema.     Left lower leg: No edema.  Skin:    General: Skin is warm and dry.  Neurological:     Mental Status: She is alert. Mental status is at baseline.  Psychiatric:        Mood and Affect: Mood normal.      No results found for any visits on 06/01/19.     Assessment & Plan    1. Need for immunization against influenza  - Flu Vaccine QUAD 6+ mos PF IM (Fluarix Quad PF)  2. Seizure disorder (HCC)  - Lamotrigine level - QuantiFERON-TB Gold Plus - TSH - Comp. Metabolic Panel (12)  3. Active autistic disorder  - Lamotrigine level -  QuantiFERON-TB Gold Plus - TSH - Comp. Metabolic Panel (12)  4. Anemia, unspecified type Menses every 3 months. - CBC w/Diff/Platelet - Comp. Metabolic Panel (12)     Hadeel Hillebrand Wendelyn BreslowGilbert Jr, MD  [facility]

## 2019-06-07 DIAGNOSIS — F840 Autistic disorder: Secondary | ICD-10-CM | POA: Diagnosis not present

## 2019-06-07 DIAGNOSIS — D649 Anemia, unspecified: Secondary | ICD-10-CM | POA: Diagnosis not present

## 2019-06-07 DIAGNOSIS — G40909 Epilepsy, unspecified, not intractable, without status epilepticus: Secondary | ICD-10-CM | POA: Diagnosis not present

## 2019-06-09 LAB — COMP. METABOLIC PANEL (12)
AST: 12 IU/L (ref 0–40)
Albumin/Globulin Ratio: 1.8 (ref 1.2–2.2)
Albumin: 4.3 g/dL (ref 3.9–5.0)
Alkaline Phosphatase: 73 IU/L (ref 39–117)
BUN/Creatinine Ratio: 14 (ref 9–23)
BUN: 10 mg/dL (ref 6–20)
Bilirubin Total: 0.3 mg/dL (ref 0.0–1.2)
Calcium: 9.5 mg/dL (ref 8.7–10.2)
Chloride: 105 mmol/L (ref 96–106)
Creatinine, Ser: 0.74 mg/dL (ref 0.57–1.00)
GFR calc Af Amer: 131 mL/min/{1.73_m2} (ref >59)
GFR calc non Af Amer: 114 mL/min/{1.73_m2} (ref >59)
Globulin, Total: 2.4 g/dL (ref 1.5–4.5)
Glucose: 80 mg/dL (ref 65–99)
Potassium: 4.5 mmol/L (ref 3.5–5.2)
Sodium: 139 mmol/L (ref 134–144)
Total Protein: 6.7 g/dL (ref 6.0–8.5)

## 2019-06-09 LAB — CBC WITH DIFFERENTIAL/PLATELET
Basophils Absolute: 0 10*3/uL (ref 0.0–0.2)
Basos: 0 %
EOS (ABSOLUTE): 0.1 10*3/uL (ref 0.0–0.4)
Eos: 1 %
Hematocrit: 36.3 % (ref 34.0–46.6)
Hemoglobin: 12.2 g/dL (ref 11.1–15.9)
Immature Grans (Abs): 0 10*3/uL (ref 0.0–0.1)
Immature Granulocytes: 0 %
Lymphocytes Absolute: 3 10*3/uL (ref 0.7–3.1)
Lymphs: 33 %
MCH: 30.3 pg (ref 26.6–33.0)
MCHC: 33.6 g/dL (ref 31.5–35.7)
MCV: 90 fL (ref 79–97)
Monocytes Absolute: 0.4 10*3/uL (ref 0.1–0.9)
Monocytes: 4 %
Neutrophils Absolute: 5.5 10*3/uL (ref 1.4–7.0)
Neutrophils: 62 %
Platelets: 323 10*3/uL (ref 150–450)
RBC: 4.02 x10E6/uL (ref 3.77–5.28)
RDW: 12.5 % (ref 11.7–15.4)
WBC: 9 10*3/uL (ref 3.4–10.8)

## 2019-06-09 LAB — QUANTIFERON-TB GOLD PLUS
QuantiFERON Mitogen Value: >10 IU/mL
QuantiFERON Nil Value: 0.01 IU/mL
QuantiFERON TB1 Ag Value: 0.01 IU/mL
QuantiFERON TB2 Ag Value: 0.01 IU/mL
QuantiFERON-TB Gold Plus: NEGATIVE

## 2019-06-09 LAB — TSH: TSH: 1.57 u[IU]/mL (ref 0.450–4.500)

## 2019-06-09 LAB — LAMOTRIGINE LEVEL: Lamotrigine Lvl: 4.5 ug/mL (ref 2.0–20.0)

## 2019-06-15 ENCOUNTER — Ambulatory Visit (INDEPENDENT_AMBULATORY_CARE_PROVIDER_SITE_OTHER): Payer: 59 | Admitting: Physician Assistant

## 2019-06-15 ENCOUNTER — Encounter: Payer: Self-pay | Admitting: Physician Assistant

## 2019-06-15 VITALS — BP 109/68 | HR 91 | Temp 96.9°F | Resp 16 | Ht 63.0 in | Wt 148.0 lb

## 2019-06-15 DIAGNOSIS — H6501 Acute serous otitis media, right ear: Secondary | ICD-10-CM | POA: Diagnosis not present

## 2019-06-15 DIAGNOSIS — R569 Unspecified convulsions: Secondary | ICD-10-CM

## 2019-06-15 MED ORDER — AMOXICILLIN 500 MG PO CAPS: 1000.0000 mg | ORAL_CAPSULE | Freq: Two times a day (BID) | ORAL | 0 refills | Status: DC

## 2019-06-15 NOTE — Progress Notes (Signed)
Patient: Grace Garcia Female    DOB: 09/16/1996   23 y.o.   MRN: [id number] Visit Date: 06/15/2019  Today's Provider: Margaretann LovelessJennifer M Adrionna Delcid, PA-C   Chief Complaint  Patient presents with  . Seizures   Subjective:     HPI   Patient is here to get checked out after she had a seizure yesterday. She occasionally gets seizures when she has infections and mother would like her examined for possible infection. Her lamictal was also recently increased as well do to low trough levels. This was done approximately two weeks ago.   No Known Allergies   Current Outpatient Medications:  .  DIASTAT ACUDIAL 20 MG GEL, , Disp: , Rfl: 0 .  gabapentin (NEURONTIN) 300 MG capsule, Take 300 mg by mouth 2 (two) times daily., Disp: , Rfl:  .  lamoTRIgine (LAMICTAL) 200 MG tablet, Take 200 mg by mouth 2 (two) times daily. Take 200 mg twice a day, Disp: , Rfl:  .  mometasone (NASONEX) 50 MCG/ACT nasal spray, PLACE 2 SPRAYS INTO THE NOSE DAILY., Disp: 17 g, Rfl: 12 .  MONO-LINYAH 0.25-35 MG-MCG tablet, TAKE 1 TABLET BY MOUTH DAILY, Disp: 84 tablet, Rfl: 11 .  amoxicillin (AMOXIL) 500 MG capsule, Take 2 capsules (1,000 mg total) by mouth 2 (two) times daily. (Patient not taking: Reported on 06/01/2019), Disp: 28 capsule, Rfl: 2 .  ciprofloxacin-dexamethasone (CIPRODEX) OTIC suspension, Place 4 drops into both ears 2 (two) times daily. (Patient not taking: Reported on 06/15/2019), Disp: 7.5 mL, Rfl: 2  Review of Systems  Constitutional: Negative for appetite change, chills, fatigue and fever.  Respiratory: Negative for chest tightness and shortness of breath.   Cardiovascular: Negative for chest pain and palpitations.  Gastrointestinal: Negative for abdominal pain, nausea and vomiting.  Neurological: Positive for seizures. Negative for dizziness and weakness.    Social History   Tobacco Use  . Smoking status: Never Smoker  . Smokeless tobacco: Never Used  Substance Use Topics  . Alcohol use:  No      Objective:   BP 109/68 (BP Location: Right Arm, Patient Position: Sitting, Cuff Size: Normal)   Pulse 91   Temp (!) 96.9 F (36.1 C) (Other (Comment))   Resp 16   Ht 5\' 3"  (1.6 m)   Wt 148 lb (67.1 kg)   LMP 05/15/2019   SpO2 97%   BMI 26.22 kg/m  Vitals:   06/15/19 1045  BP: 109/68  Pulse: 91  Resp: 16  Temp: (!) 96.9 F (36.1 C)  TempSrc: Other (Comment)  SpO2: 97%  Weight: 148 lb (67.1 kg)  Height: 5\' 3"  (1.6 m)  Body mass index is 26.22 kg/m.   Physical Exam Vitals signs reviewed.  Constitutional:      General: She is not in acute distress.    Appearance: Normal appearance. She is well-developed and normal weight. She is not ill-appearing or diaphoretic.  HENT:     Head: Normocephalic and atraumatic.     Right Ear: Hearing, ear canal and external ear normal. A middle ear effusion is present. A PE tube is present. Tympanic membrane is erythematous and bulging.     Left Ear: Hearing, tympanic membrane, ear canal and external ear normal.  No middle ear effusion. A PE tube is present.     Nose: Nose normal.     Mouth/Throat:     Pharynx: Uvula midline. No oropharyngeal exudate.  Eyes:     General: No scleral icterus.  Right eye: No discharge.        Left eye: No discharge.     Conjunctiva/sclera: Conjunctivae normal.     Pupils: Pupils are equal, round, and reactive to light.  Neck:     Musculoskeletal: Normal range of motion and neck supple.     Thyroid: No thyromegaly.     Trachea: No tracheal deviation.  Cardiovascular:     Rate and Rhythm: Normal rate and regular rhythm.     Heart sounds: Normal heart sounds. No murmur. No friction rub. No gallop.   Pulmonary:     Effort: Pulmonary effort is normal. No respiratory distress.     Breath sounds: Normal breath sounds. No stridor. No wheezing or rales.  Lymphadenopathy:     Cervical: No cervical adenopathy.  Skin:    General: Skin is warm and dry.  Neurological:     Mental Status: She is  alert.      No results found for any visits on 06/15/19.     Assessment & Plan    1. Seizure [facility]) Had yesterday afternoon. Postictal all night. No recurrence. Last one prior to was over 9 months ago.  2. Right acute serous otitis media, recurrence not specified Right ear looks like infection with erythema around the TM tube with opague and bulging TM. Will treat with amoxicillin as below. She has ciprodex drops on hand at home. May use if needed. Call if worsening or if seizure activity recurs.  - amoxicillin (AMOXIL) 500 MG capsule; Take 2 capsules (1,000 mg total) by mouth 2 (two) times daily.  Dispense: 28 capsule; Refill: 0     Mar Daring, PA-C  [facility]

## 2019-06-27 ENCOUNTER — Encounter: Payer: Self-pay | Admitting: Family Medicine

## 2019-06-27 ENCOUNTER — Ambulatory Visit (INDEPENDENT_AMBULATORY_CARE_PROVIDER_SITE_OTHER): Payer: 59 | Admitting: Family Medicine

## 2019-06-27 ENCOUNTER — Other Ambulatory Visit: Payer: Self-pay

## 2019-06-27 VITALS — BP 120/72 | HR 88 | Temp 97.3°F | Resp 16 | Wt 149.0 lb

## 2019-06-27 DIAGNOSIS — R569 Unspecified convulsions: Secondary | ICD-10-CM

## 2019-06-27 DIAGNOSIS — H6692 Otitis media, unspecified, left ear: Secondary | ICD-10-CM | POA: Diagnosis not present

## 2019-06-27 DIAGNOSIS — F840 Autistic disorder: Secondary | ICD-10-CM | POA: Diagnosis not present

## 2019-06-27 MED ORDER — CEFDINIR 300 MG PO CAPS: 300.0000 mg | ORAL_CAPSULE | Freq: Two times a day (BID) | ORAL | 0 refills | Status: AC

## 2019-06-27 NOTE — Progress Notes (Signed)
       Patient: Grace Garcia Female    DOB: 1996/04/30   23 y.o.   MRN: [id number] Visit Date: 06/27/2019  Today's Provider: Wilhemena Durie, MD   Chief Complaint  Patient presents with  . Seizures   Subjective:     HPI  Patient had seizure yesterday afternoon. She loss consciousness and stopped breathing. This is the second episode since last week. She has had 2 seizures in 2 weeks and mother says she became cyanotic during yesterdays seizure. She has returned to normal.  No Known Allergies   Current Outpatient Medications:  .  DIASTAT ACUDIAL 20 MG GEL, , Disp: , Rfl: 0 .  gabapentin (NEURONTIN) 300 MG capsule, Take 300 mg by mouth 2 (two) times daily., Disp: , Rfl:  .  lamoTRIgine (LAMICTAL) 200 MG tablet, Take 200 mg by mouth 2 (two) times daily. Take 200 mg twice a day, Disp: , Rfl:  .  mometasone (NASONEX) 50 MCG/ACT nasal spray, PLACE 2 SPRAYS INTO THE NOSE DAILY., Disp: 17 g, Rfl: 12 .  MONO-LINYAH 0.25-35 MG-MCG tablet, TAKE 1 TABLET BY MOUTH DAILY, Disp: 84 tablet, Rfl: 11 .  amoxicillin (AMOXIL) 500 MG capsule, Take 2 capsules (1,000 mg total) by mouth 2 (two) times daily. (Patient not taking: Reported on 06/27/2019), Disp: 28 capsule, Rfl: 0  Review of Systems  Constitutional: Negative.   HENT: Positive for ear pain.   Respiratory: Negative.   Cardiovascular: Negative.   Allergic/Immunologic: Negative.   Neurological: Positive for seizures.  Psychiatric/Behavioral: Negative.     Social History   Tobacco Use  . Smoking status: Never Smoker  . Smokeless tobacco: Never Used  Substance Use Topics  . Alcohol use: No      Objective:   BP 120/72 (BP Location: Right Arm, Patient Position: Sitting, Cuff Size: Normal)   Pulse 88   Temp (!) 97.3 F (36.3 C) (Temporal)   Resp 16   Wt 149 lb (67.6 kg)   SpO2 98%   BMI 26.39 kg/m  Vitals:   06/27/19 1037  BP: 120/72  Pulse: 88  Resp: 16  Temp: (!) 97.3 F (36.3 C)  TempSrc: Temporal  SpO2: 98%   Weight: 149 lb (67.6 kg)  Body mass index is 26.39 kg/m.   Physical Exam Vitals signs reviewed.  Constitutional:      Appearance: Normal appearance.  HENT:     Head: Atraumatic.     Comments: Left OM with drainage from purulent drainage from myringotomy tube.    Right Ear: External ear normal.     Left Ear: External ear normal.      No results found for any visits on 06/27/19.     Assessment & Plan    1. Seizure [facility]) Mother to call Dr Manuella Ghazi about increasing meds to help with seizure threshold.  2. Active autistic disorder   3. OM (otitis media), recurrent, left Omnicef for 10 days.     Richard Cranford Mon, MD  [facility]

## 2019-07-04 DIAGNOSIS — Z79899 Other long term (current) drug therapy: Secondary | ICD-10-CM | POA: Diagnosis not present

## 2019-07-06 ENCOUNTER — Other Ambulatory Visit: Payer: Self-pay

## 2019-07-06 MED ORDER — NORGESTIMATE-ETH ESTRADIOL 0.25-35 MG-MCG PO TABS: 1.0000 | ORAL_TABLET | Freq: Every day | ORAL | 11 refills | Status: DC

## 2019-07-06 NOTE — Telephone Encounter (Signed)
Please sign off on prescription.

## 2019-07-13 NOTE — Progress Notes (Signed)
Patient: Grace Garcia Female    DOB: [birth year]   23 y.o.   MRN: [id number] Visit Date: 07/14/2019  Today's Provider: Mar Daring, PA-C   Chief Complaint  Patient presents with  . Follow-up   Subjective:     HPI   Patient is here for follow-up Left OM.She was seen 06/27/19 patient had a seizure the day before. She was prescribed Omnicef for 10 days for recurrent left OM. She develops seizures with infections and had two consecutive ear infections (first the right then the left) both involving a seizure. She was treated with amoxicillin initially then omnicef and ear drops. She has a follow up with Dr. Manuella Ghazi today.   No Known Allergies   Current Outpatient Medications:  .  DIASTAT ACUDIAL 20 MG GEL, , Disp: , Rfl: 0 .  gabapentin (NEURONTIN) 300 MG capsule, Take 300 mg by mouth 2 (two) times daily., Disp: , Rfl:  .  lamoTRIgine (LAMICTAL) 200 MG tablet, Take 200 mg by mouth 2 (two) times daily. Take 200 mg in the am and 300 mg in the pm, Disp: , Rfl:  .  mometasone (NASONEX) 50 MCG/ACT nasal spray, PLACE 2 SPRAYS INTO THE NOSE DAILY., Disp: 17 g, Rfl: 12 .  norgestimate-ethinyl estradiol (MONO-LINYAH) 0.25-35 MG-MCG tablet, Take 1 tablet by mouth daily. Skip placebo pills, Disp: 84 tablet, Rfl: 11 .  amoxicillin (AMOXIL) 500 MG capsule, Take 2 capsules (1,000 mg total) by mouth 2 (two) times daily. (Patient not taking: Reported on 06/27/2019), Disp: 28 capsule, Rfl: 0  Review of Systems  Constitutional: Negative for appetite change, chills, fatigue and fever.  Respiratory: Negative for chest tightness and shortness of breath.   Cardiovascular: Negative for chest pain and palpitations.  Gastrointestinal: Negative for abdominal pain, nausea and vomiting.  Neurological: Positive for seizures (none since last one). Negative for dizziness and weakness.    Social History   Tobacco Use  . Smoking status: Never Smoker  . Smokeless tobacco: Never Used  Substance  Use Topics  . Alcohol use: No      Objective:   BP 102/70 (BP Location: Right Arm, Patient Position: Sitting, Cuff Size: Large)   Pulse 92   Temp (!) 96.9 F (36.1 C) (Other (Comment))   Resp 16   Ht 5\' 3"  (1.6 m)   Wt 147 lb (66.7 kg)   SpO2 97%   BMI 26.04 kg/m  Vitals:   07/14/19 0813  BP: 102/70  Pulse: 92  Resp: 16  Temp: (!) 96.9 F (36.1 C)  TempSrc: Other (Comment)  SpO2: 97%  Weight: 147 lb (66.7 kg)  Height: 5\' 3"  (1.6 m)  Body mass index is 26.04 kg/m.   Physical Exam Vitals signs reviewed.  Constitutional:      General: She is not in acute distress.    Appearance: Normal appearance. She is well-developed. She is not ill-appearing or diaphoretic.  HENT:     Head: Normocephalic and atraumatic.     Right Ear: Hearing, tympanic membrane, ear canal and external ear normal.     Left Ear: Hearing, tympanic membrane, ear canal and external ear normal.     Ears:     Comments: Tubes in place    Nose: Nose normal.     Mouth/Throat:     Pharynx: Uvula midline. No oropharyngeal exudate.  Eyes:     General: No scleral icterus.       Right eye: No discharge.  Left eye: No discharge.     Conjunctiva/sclera: Conjunctivae normal.     Pupils: Pupils are equal, round, and reactive to light.  Neck:     Musculoskeletal: Normal range of motion and neck supple.     Thyroid: No thyromegaly.     Trachea: No tracheal deviation.  Cardiovascular:     Rate and Rhythm: Normal rate and regular rhythm.     Heart sounds: Normal heart sounds. No murmur. No friction rub. No gallop.   Pulmonary:     Effort: Pulmonary effort is normal. No respiratory distress.     Breath sounds: Normal breath sounds. No stridor. No wheezing or rales.  Lymphadenopathy:     Cervical: No cervical adenopathy.  Skin:    General: Skin is warm and dry.  Neurological:     Mental Status: She is alert.      No results found for any visits on 07/14/19.     Assessment & Plan    1. Seizure  (HCC) No seizure activity since last one on 06/26/19. Has been tolerating increased medications. Has appt with Dr. Sherryll Burger today.   2. Recurrent acute suppurative otitis media without spontaneous rupture of tympanic membrane of both sides Currently resolved.      Margaretann Loveless, PA-C  [facility]

## 2019-07-14 ENCOUNTER — Ambulatory Visit (INDEPENDENT_AMBULATORY_CARE_PROVIDER_SITE_OTHER): Payer: 59 | Admitting: Physician Assistant

## 2019-07-14 ENCOUNTER — Other Ambulatory Visit: Payer: Self-pay

## 2019-07-14 ENCOUNTER — Encounter: Payer: Self-pay | Admitting: Physician Assistant

## 2019-07-14 VITALS — BP 102/70 | HR 92 | Temp 96.9°F | Resp 16 | Ht 63.0 in | Wt 147.0 lb

## 2019-07-14 DIAGNOSIS — F840 Autistic disorder: Secondary | ICD-10-CM | POA: Diagnosis not present

## 2019-07-14 DIAGNOSIS — H66006 Acute suppurative otitis media without spontaneous rupture of ear drum, recurrent, bilateral: Secondary | ICD-10-CM | POA: Diagnosis not present

## 2019-07-14 DIAGNOSIS — R569 Unspecified convulsions: Secondary | ICD-10-CM | POA: Diagnosis not present

## 2019-09-05 ENCOUNTER — Other Ambulatory Visit: Payer: Self-pay | Admitting: *Deleted

## 2019-09-05 DIAGNOSIS — G40909 Epilepsy, unspecified, not intractable, without status epilepticus: Secondary | ICD-10-CM

## 2019-09-06 DIAGNOSIS — G40909 Epilepsy, unspecified, not intractable, without status epilepticus: Secondary | ICD-10-CM | POA: Diagnosis not present

## 2019-09-07 LAB — LAMOTRIGINE LEVEL: Lamotrigine Lvl: 7.2 ug/mL (ref 2.0–20.0)

## 2019-10-25 DIAGNOSIS — Z79899 Other long term (current) drug therapy: Secondary | ICD-10-CM | POA: Diagnosis not present

## 2019-10-25 DIAGNOSIS — R569 Unspecified convulsions: Secondary | ICD-10-CM | POA: Diagnosis not present

## 2019-11-19 ENCOUNTER — Ambulatory Visit: Payer: 59 | Attending: Internal Medicine

## 2019-11-19 DIAGNOSIS — Z23 Encounter for immunization: Secondary | ICD-10-CM | POA: Insufficient documentation

## 2019-11-19 NOTE — Progress Notes (Signed)
   Covid-19 Vaccination Clinic  Name:  Grace Garcia    MRN: [id number] DOB: 03/09/1996  11/19/2019  Grace Garcia was observed post Covid-19 immunization for 15 minutes without incidence. She was provided with Vaccine Information Sheet and instruction to access the V-Safe system.   Grace Garcia was instructed to call 911 with any severe reactions post vaccine: Marland Kitchen Difficulty breathing  . Swelling of your face and throat  . A fast heartbeat  . A bad rash all over your body  . Dizziness and weakness    Immunizations Administered    Name Date Dose VIS Date Route   Pfizer COVID-19 Vaccine 11/19/2019 11:27 AM 0.3 mL 09/08/2019 Intramuscular   Manufacturer: [employer], [employer]   Lot: [id number]   NDC: [address]-5

## 2019-12-12 ENCOUNTER — Ambulatory Visit: Payer: 59 | Attending: Internal Medicine

## 2019-12-12 DIAGNOSIS — Z23 Encounter for immunization: Secondary | ICD-10-CM

## 2019-12-12 NOTE — Progress Notes (Signed)
   Covid-19 Vaccination Clinic  Name:  JASIME WESTERGREN    MRN: [id number] DOB: 1996/03/15  12/12/2019  Ms. Mohamed was observed post Covid-19 immunization for 15 minutes without incident. She was provided with Vaccine Information Sheet and instruction to access the V-Safe system.   Ms. Santoli was instructed to call 911 with any severe reactions post vaccine: Marland Kitchen Difficulty breathing  . Swelling of face and throat  . A fast heartbeat  . A bad rash all over body  . Dizziness and weakness   Immunizations Administered    Name Date Dose VIS Date Route   Pfizer COVID-19 Vaccine 12/12/2019  3:52 PM 0.3 mL 09/08/2019 Intramuscular   Manufacturer: [employer], [employer]   Lot: [id number]   NDC: [address]-1

## 2020-01-05 ENCOUNTER — Ambulatory Visit (INDEPENDENT_AMBULATORY_CARE_PROVIDER_SITE_OTHER): Payer: 59 | Admitting: Physician Assistant

## 2020-01-05 ENCOUNTER — Encounter: Payer: Self-pay | Admitting: Physician Assistant

## 2020-01-05 ENCOUNTER — Ambulatory Visit
Admission: RE | Admit: 2020-01-05 | Discharge: 2020-01-05 | Disposition: A | Discharge status: Home / Self Care | Payer: 59 | Source: Ambulatory Visit | Attending: Physician Assistant | Admitting: Physician Assistant

## 2020-01-05 ENCOUNTER — Other Ambulatory Visit: Payer: Self-pay

## 2020-01-05 VITALS — BP 105/70 | HR 116 | Temp 97.5°F | Resp 16 | Wt 155.2 lb

## 2020-01-05 DIAGNOSIS — H66002 Acute suppurative otitis media without spontaneous rupture of ear drum, left ear: Secondary | ICD-10-CM

## 2020-01-05 DIAGNOSIS — G40909 Epilepsy, unspecified, not intractable, without status epilepticus: Secondary | ICD-10-CM | POA: Diagnosis not present

## 2020-01-05 DIAGNOSIS — T07XXXA Unspecified multiple injuries, initial encounter: Secondary | ICD-10-CM | POA: Insufficient documentation

## 2020-01-05 DIAGNOSIS — W19XXXA Unspecified fall, initial encounter: Secondary | ICD-10-CM

## 2020-01-05 DIAGNOSIS — S0993XA Unspecified injury of face, initial encounter: Secondary | ICD-10-CM | POA: Diagnosis not present

## 2020-01-05 DIAGNOSIS — R569 Unspecified convulsions: Secondary | ICD-10-CM | POA: Diagnosis not present

## 2020-01-05 MED ORDER — AMOXICILLIN 500 MG PO CAPS: 500.0000 mg | ORAL_CAPSULE | Freq: Three times a day (TID) | ORAL | 0 refills | Status: DC

## 2020-01-05 NOTE — Progress Notes (Signed)
Established patient visit      Patient: Grace Garcia   DOB: 1996-01-08   24 y.o. Female  MRN: [id number] Visit Date: 01/09/2020  Today's healthcare provider: Margaretann Loveless, PA-C  Subjective:    Chief Complaint  Patient presents with  . Seizures   HPI  Patient had a seizure this morning.She is here with her dad.She lost consciousness and fell, hitting her forehead and right cheek on the floor. Patient does not recall seizure.   Patient Active Problem List   Diagnosis Date Noted  . Active autistic disorder 01/07/2016  . Seizure (HCC) 04/28/2014   Past Medical History:  Diagnosis Date  . Autistic behavior   . Seizures (HCC)        Medications: Outpatient Medications Prior to Visit  Medication Sig  . DIASTAT ACUDIAL 20 MG GEL   . gabapentin (NEURONTIN) 300 MG capsule Take 300 mg by mouth 2 (two) times daily.  Marland Kitchen lamoTRIgine (LAMICTAL) 200 MG tablet Take 200 mg by mouth 2 (two) times daily. Take 200 mg in the am and 300 mg in the pm  . mometasone (NASONEX) 50 MCG/ACT nasal spray PLACE 2 SPRAYS INTO THE NOSE DAILY.  . norgestimate-ethinyl estradiol (MONO-LINYAH) 0.25-35 MG-MCG tablet Take 1 tablet by mouth daily. Skip placebo pills   No facility-administered medications prior to visit.    Review of Systems  Constitutional: Positive for fatigue.  HENT: Positive for facial swelling.   Neurological: Positive for seizures.    Last CBC Lab Results  Component Value Date   WBC 9.0 06/07/2019   HGB 12.2 06/07/2019   HCT 36.3 06/07/2019   MCV 90 06/07/2019   MCH 30.3 06/07/2019   RDW 12.5 06/07/2019   PLT 323 06/07/2019   Last metabolic panel Lab Results  Component Value Date   GLUCOSE 80 06/07/2019   NA 139 06/07/2019   K 4.5 06/07/2019   CL 105 06/07/2019   CO2 20 05/19/2017   BUN 10 06/07/2019   CREATININE 0.74 06/07/2019   GFRNONAA 114 06/07/2019   GFRAA 131 06/07/2019   CALCIUM 9.5 06/07/2019   PROT 6.7 06/07/2019   ALBUMIN 4.3 06/07/2019    LABGLOB 2.4 06/07/2019   AGRATIO 1.8 06/07/2019   BILITOT 0.3 06/07/2019   ALKPHOS 73 06/07/2019   AST 12 06/07/2019   ALT 8 05/19/2017   ANIONGAP 9 04/02/2014        Objective:    BP 105/70 (BP Location: Left Arm, Patient Position: Sitting, Cuff Size: Large)   Pulse (!) 116   Temp (!) 97.5 F (36.4 C) (Temporal)   Resp 16   Wt 155 lb 3.2 oz (70.4 kg)   BMI 27.49 kg/m  BP Readings from Last 3 Encounters:  01/05/20 105/70  07/14/19 102/70  06/27/19 120/72   Wt Readings from Last 3 Encounters:  01/05/20 155 lb 3.2 oz (70.4 kg)  07/14/19 147 lb (66.7 kg)  06/27/19 149 lb (67.6 kg)      Physical Exam Vitals reviewed.  Constitutional:      General: She is not in acute distress.    Appearance: Normal appearance. She is well-developed. She is not ill-appearing or diaphoretic.  HENT:     Head: Normocephalic. Abrasion, contusion and right periorbital erythema present.     Jaw: There is normal jaw occlusion.      Right Ear: Hearing, ear canal and external ear normal. No drainage or tenderness. No middle ear effusion. A PE tube is present. Tympanic membrane is scarred.  Tympanic membrane is not injected, perforated or erythematous.     Left Ear: Hearing, ear canal and external ear normal. Drainage and tenderness present. A middle ear effusion is present. A PE tube is present. Tympanic membrane is injected, scarred and erythematous.     Nose: Nose normal.     Mouth/Throat:     Pharynx: Uvula midline. No oropharyngeal exudate.  Eyes:     General: Lids are normal. No visual field deficit or scleral icterus.       Right eye: No discharge.        Left eye: No discharge.     Extraocular Movements: Extraocular movements intact.     Conjunctiva/sclera: Conjunctivae normal.     Pupils: Pupils are equal, round, and reactive to light.  Neck:     Thyroid: No thyromegaly.     Trachea: No tracheal deviation.  Cardiovascular:     Rate and Rhythm: Normal rate and regular rhythm.      Heart sounds: Normal heart sounds. No murmur. No friction rub. No gallop.   Pulmonary:     Effort: Pulmonary effort is normal. No respiratory distress.     Breath sounds: Normal breath sounds. No stridor. No wheezing or rales.  Musculoskeletal:     Cervical back: Normal range of motion and neck supple.  Lymphadenopathy:     Cervical: No cervical adenopathy.  Skin:    General: Skin is warm and dry.  Neurological:     Mental Status: She is alert.    CLINICAL DATA:  24 year old female with seizure and trauma to the right side of the face.  EXAM: FACIAL BONES COMPLETE 3+V  COMPARISON:  None.  FINDINGS: No definite acute fracture identified. Probable old fracture of one of the orbital floors seen on the lateral view. No mandibular subluxation noted. The maxillary sinuses are aerated. There is partial opacification of the frontal sinuses. The mastoid air cells are not aerated. Probable impaction of the right maxillary wisdom tooth.  IMPRESSION: No definite acute fracture.   Electronically Signed   By: Anner Crete M.D.   On: 01/06/2020 15:40   No results found for any visits on 01/05/20.    Assessment & Plan:    1. Non-recurrent acute suppurative otitis media of left ear without spontaneous rupture of tympanic membrane Patient most often gets seizures with infections. Purulent drainage noted from the left TM tube. Will treat with Amoxil as below. Xrays were normal. Continue icing for facial contusions. Will recheck in a couple weeks for improvement. Call if symptoms worsen with face or if seizures recur.  - amoxicillin (AMOXIL) 500 MG capsule; Take 1 capsule (500 mg total) by mouth 3 (three) times daily.  Dispense: 30 capsule; Refill: 0  2. Seizure disorder [facility]) See above medical treatment plan. - DG Facial Bones Complete; Future  3. Fall, initial encounter See above medical treatment plan. - DG Facial Bones Complete; Future      Rubye Beach  [facility] [phone] (phone) [phone] (fax)  [place]

## 2020-01-08 ENCOUNTER — Telehealth: Payer: Self-pay

## 2020-01-08 NOTE — Telephone Encounter (Signed)
Mother advised by provider

## 2020-01-08 NOTE — Telephone Encounter (Signed)
-----   Message from Margaretann Loveless, [place] sent at 01/07/2020  1:51 PM EDT ----- No new fractures noted. Possible old fracture that has healed was noted. There is an impacted wisdom tooth in the right upper side.

## 2020-01-09 ENCOUNTER — Encounter: Payer: Self-pay | Admitting: Physician Assistant

## 2020-01-15 NOTE — Progress Notes (Signed)
Established patient visit    Patient: Grace Garcia   DOB: Mar 23, 1996   24 y.o. Female  MRN: [id number] Visit Date: 01/19/2020  Today's healthcare provider: Mar Daring, PA-C   Chief Complaint  Patient presents with  . Follow-up    ears   Subjective    HPI Follow up for otitis media of left ear/seizures  The patient was last seen for this 2 weeks ago. Changes made at last visit include patient often gets seizures with infections. She was treated with Amoxil.  She reports excellent compliance with treatment. She feels that condition is Improved. She is not having side effects.   -----------------------------------------------------------------------------------------    Patient Active Problem List   Diagnosis Date Noted  . Active autistic disorder 01/07/2016  . Seizure ([place]) 04/28/2014   Past Medical History:  Diagnosis Date  . Autistic behavior   . Seizures ([place])    No Known Allergies     Medications: Outpatient Medications Prior to Visit  Medication Sig  . DIASTAT ACUDIAL 20 MG GEL   . gabapentin (NEURONTIN) 300 MG capsule Take 600 mg by mouth 2 (two) times daily.   Marland Kitchen lamoTRIgine (LAMICTAL) 200 MG tablet Take 200 mg by mouth 2 (two) times daily. Take 200 mg in the am and 300 mg in the pm  . mometasone (NASONEX) 50 MCG/ACT nasal spray PLACE 2 SPRAYS INTO THE NOSE DAILY.  . norgestimate-ethinyl estradiol (MONO-LINYAH) 0.25-35 MG-MCG tablet Take 1 tablet by mouth daily. Skip placebo pills  . amoxicillin (AMOXIL) 500 MG capsule Take 1 capsule (500 mg total) by mouth 3 (three) times daily.   No facility-administered medications prior to visit.    Review of Systems  Constitutional: Negative for chills and fever.  HENT: Negative for ear discharge and ear pain.   Gastrointestinal: Negative for constipation, diarrhea, nausea and vomiting.  Neurological: Positive for headaches.        Objective    BP 124/80 (BP Location: Right Arm, Patient  Position: Sitting, Cuff Size: Normal)   Pulse 90   Temp (!) 97.1 F (36.2 C) (Temporal)   Resp 16   Wt 160 lb (72.6 kg)   BMI 28.34 kg/m  BP Readings from Last 3 Encounters:  01/19/20 124/80  01/05/20 105/70  07/14/19 102/70   Wt Readings from Last 3 Encounters:  01/19/20 160 lb (72.6 kg)  01/05/20 155 lb 3.2 oz (70.4 kg)  07/14/19 147 lb (66.7 kg)      Physical Exam Vitals reviewed.  Constitutional:      General: She is not in acute distress.    Appearance: Normal appearance. She is well-developed. She is not ill-appearing or diaphoretic.  HENT:     Head: Normocephalic and atraumatic.     Right Ear: Hearing, ear canal and external ear normal. A PE tube is present. Tympanic membrane is scarred. Tympanic membrane is not perforated or erythematous.     Left Ear: Hearing, ear canal and external ear normal. A middle ear effusion is present. A PE tube is present. Tympanic membrane is scarred. Tympanic membrane is not perforated or erythematous.     Ears:     Comments: Purulent drainage still coming from left tube. Much less and TM is less swollen and red compared to 2 weeks ago    Mouth/Throat:     Pharynx: Uvula midline.  Eyes:     General: No scleral icterus.       Right eye: No discharge.  Left eye: No discharge.     Extraocular Movements: Extraocular movements intact.     Conjunctiva/sclera: Conjunctivae normal.     Pupils: Pupils are equal, round, and reactive to light.  Neck:     Thyroid: No thyromegaly.     Trachea: No tracheal deviation.  Cardiovascular:     Rate and Rhythm: Normal rate and regular rhythm.     Heart sounds: Normal heart sounds. No murmur. No friction rub. No gallop.   Pulmonary:     Effort: Pulmonary effort is normal. No respiratory distress.     Breath sounds: Normal breath sounds. No stridor. No wheezing or rales.  Musculoskeletal:     Cervical back: Normal range of motion and neck supple.  Lymphadenopathy:     Cervical: No cervical  adenopathy.  Skin:    General: Skin is warm and dry.  Neurological:     Mental Status: She is alert.       No results found for any visits on 01/19/20.   Assessment & Plan    1. Recurrent acute suppurative otitis media without spontaneous rupture of left tympanic membrane Change from amoxicillin to Augmentin as below. Continue ear drops at home. May return in 2 weeks for recheck.  - amoxicillin-clavulanate (AUGMENTIN) 875-125 MG tablet; Take 1 tablet by mouth 2 (two) times daily.  Dispense: 20 tablet; Refill: 0   No follow-ups on file.      Delmer Islam, PA-C, have reviewed all documentation for this visit. The documentation on 01/19/20 for the exam, diagnosis, procedures, and orders are all accurate and complete.   Reine Just  [facility] [phone] (phone) [phone] (fax)  [facility]

## 2020-01-19 ENCOUNTER — Other Ambulatory Visit: Payer: Self-pay

## 2020-01-19 ENCOUNTER — Ambulatory Visit (INDEPENDENT_AMBULATORY_CARE_PROVIDER_SITE_OTHER): Payer: 59 | Admitting: Physician Assistant

## 2020-01-19 ENCOUNTER — Ambulatory Visit: Payer: Self-pay | Admitting: Physician Assistant

## 2020-01-19 ENCOUNTER — Encounter: Payer: Self-pay | Admitting: Physician Assistant

## 2020-01-19 VITALS — BP 124/80 | HR 90 | Temp 97.1°F | Resp 16 | Wt 160.0 lb

## 2020-01-19 DIAGNOSIS — H66005 Acute suppurative otitis media without spontaneous rupture of ear drum, recurrent, left ear: Secondary | ICD-10-CM

## 2020-01-19 MED ORDER — AMOXICILLIN-POT CLAVULANATE 875-125 MG PO TABS: 1.0000 | ORAL_TABLET | Freq: Two times a day (BID) | ORAL | 0 refills | Status: DC

## 2020-01-19 NOTE — Patient Instructions (Signed)

## 2020-02-22 ENCOUNTER — Ambulatory Visit (INDEPENDENT_AMBULATORY_CARE_PROVIDER_SITE_OTHER): Payer: 59 | Admitting: Physician Assistant

## 2020-02-22 ENCOUNTER — Other Ambulatory Visit: Payer: Self-pay

## 2020-02-22 ENCOUNTER — Encounter: Payer: Self-pay | Admitting: Physician Assistant

## 2020-02-22 VITALS — BP 119/74 | HR 88 | Temp 96.8°F | Resp 16 | Wt 158.0 lb

## 2020-02-22 DIAGNOSIS — Z9622 Myringotomy tube(s) status: Secondary | ICD-10-CM | POA: Diagnosis not present

## 2020-02-22 NOTE — Progress Notes (Signed)
Established patient visit   Patient: Grace Garcia   DOB: 23-Mar-1996   23 y.o. Female  MRN: [id number] Visit Date: 02/22/2020  Today's healthcare provider: Mar Daring, PA-C   Chief Complaint  Patient presents with  . 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 Had pretty severe otitis media that caused seizure. Took two rounds of antibiotics to clear.   Patient Active Problem List   Diagnosis Date Noted  . Active autistic disorder 01/07/2016  . Seizure ([place]) 04/28/2014   Past Medical History:  Diagnosis Date  . Autistic behavior   . Seizures ([place])        Medications: Outpatient Medications Prior to Visit  Medication Sig  . gabapentin (NEURONTIN) 300 MG capsule Take 600 mg by mouth 2 (two) times daily.   Marland Kitchen lamoTRIgine (LAMICTAL) 200 MG tablet Take 200 mg by mouth 2 (two) times daily. Take 200 mg in the am and 300 mg in the pm  . mometasone (NASONEX) 50 MCG/ACT nasal spray PLACE 2 SPRAYS INTO THE NOSE DAILY.  . norgestimate-ethinyl estradiol (MONO-LINYAH) 0.25-35 MG-MCG tablet Take 1 tablet by mouth daily. Skip placebo pills  . DIASTAT ACUDIAL 20 MG GEL   . [DISCONTINUED] amoxicillin-clavulanate (AUGMENTIN) 875-125 MG tablet Take 1 tablet by mouth 2 (two) times daily.   No facility-administered medications prior to visit.    Review of Systems  Constitutional: Negative.   HENT: Negative for ear discharge and ear pain.   Respiratory: Negative.   Cardiovascular: Negative.   Gastrointestinal: Negative.   Neurological: Positive for seizures (does have seizure disorder but none since last time).  Psychiatric/Behavioral: Negative.     Last CBC Lab Results  Component Value Date   WBC 9.0 06/07/2019   HGB 12.2 06/07/2019   HCT 36.3 06/07/2019   MCV 90 06/07/2019   MCH 30.3 06/07/2019   RDW 12.5 06/07/2019   PLT 323 03/50/0938   Last metabolic panel Lab Results  Component Value Date   GLUCOSE  80 06/07/2019   NA 139 06/07/2019   K 4.5 06/07/2019   CL 105 06/07/2019   CO2 20 05/19/2017   BUN 10 06/07/2019   CREATININE 0.74 06/07/2019   GFRNONAA 114 06/07/2019   GFRAA 131 06/07/2019   CALCIUM 9.5 06/07/2019   PROT 6.7 06/07/2019   ALBUMIN 4.3 06/07/2019   LABGLOB 2.4 06/07/2019   AGRATIO 1.8 06/07/2019   BILITOT 0.3 06/07/2019   ALKPHOS 73 06/07/2019   AST 12 06/07/2019   ALT 8 05/19/2017   ANIONGAP 9 04/02/2014      Objective    BP 119/74 (BP Location: Left Arm, Patient Position: Sitting, Cuff Size: Large)   Pulse 88   Temp (!) 96.8 F (36 C) (Temporal)   Resp 16   Wt 158 lb (71.7 kg)   BMI 27.99 kg/m  BP Readings from Last 3 Encounters:  02/22/20 119/74  01/19/20 124/80  01/05/20 105/70   Wt Readings from Last 3 Encounters:  02/22/20 158 lb (71.7 kg)  01/19/20 160 lb (72.6 kg)  01/05/20 155 lb 3.2 oz (70.4 kg)      Physical Exam Vitals reviewed.  Constitutional:      General: She is not in acute distress.    Appearance: Normal appearance. She is well-developed and normal weight. She is not ill-appearing or diaphoretic.  HENT:     Head: Normocephalic and atraumatic.  Right Ear: Hearing, ear canal and external ear normal. No drainage or tenderness. No middle ear effusion. A PE tube is present. Tympanic membrane is scarred. Tympanic membrane is not injected, erythematous or bulging.     Left Ear: Hearing, ear canal and external ear normal. No drainage or tenderness.  No middle ear effusion. A PE tube (tube in left ear has now shifted compared to previous exams and now tilted into scarred TM) is present. Tympanic membrane is scarred. Tympanic membrane is not injected, erythematous or bulging.     Mouth/Throat:     Pharynx: Uvula midline.  Eyes:     General: No scleral icterus.       Right eye: No discharge.        Left eye: No discharge.     Extraocular Movements: Extraocular movements intact.     Conjunctiva/sclera: Conjunctivae normal.      Pupils: Pupils are equal, round, and reactive to light.  Neck:     Thyroid: No thyromegaly.     Trachea: No tracheal deviation.  Cardiovascular:     Rate and Rhythm: Normal rate and regular rhythm.     Pulses: Normal pulses.     Heart sounds: Normal heart sounds. No murmur. No friction rub. No gallop.   Pulmonary:     Effort: Pulmonary effort is normal. No respiratory distress.     Breath sounds: Normal breath sounds. No stridor. No wheezing or rales.  Musculoskeletal:     Cervical back: Normal range of motion and neck supple.  Lymphadenopathy:     Cervical: No cervical adenopathy.  Skin:    General: Skin is warm and dry.  Neurological:     Mental Status: She is alert.       No results found for any visits on 02/22/20.  Assessment & Plan     1. S/P tympanic tube insertion Otitis media now improved and resolved. PE tube in left ear has shifted from previous infection and is now turned, tilting inward into the TM. Patient is established with Dr. Elenore Rota, but not seen for a while. New referral placed for evaluation.  - Ambulatory referral to ENT   No follow-ups on file.      Delmer Islam, PA-C, have reviewed all documentation for this visit. The documentation on 02/27/20 for the exam, diagnosis, procedures, and orders are all accurate and complete.   Reine Just  [facility] [phone] (phone) [phone] (fax)  [facility]

## 2020-02-27 ENCOUNTER — Encounter: Payer: Self-pay | Admitting: Physician Assistant

## 2020-02-28 ENCOUNTER — Ambulatory Visit: Payer: Self-pay | Admitting: Physician Assistant

## 2020-03-05 DIAGNOSIS — H6530 Chronic mucoid otitis media, unspecified ear: Secondary | ICD-10-CM | POA: Diagnosis not present

## 2020-03-05 DIAGNOSIS — H6122 Impacted cerumen, left ear: Secondary | ICD-10-CM | POA: Diagnosis not present

## 2020-03-05 DIAGNOSIS — H900 Conductive hearing loss, bilateral: Secondary | ICD-10-CM | POA: Diagnosis not present

## 2020-03-05 DIAGNOSIS — H6980 Other specified disorders of Eustachian tube, unspecified ear: Secondary | ICD-10-CM | POA: Diagnosis not present

## 2020-03-07 ENCOUNTER — Telehealth: Payer: Self-pay

## 2020-03-07 DIAGNOSIS — H66005 Acute suppurative otitis media without spontaneous rupture of ear drum, recurrent, left ear: Secondary | ICD-10-CM

## 2020-03-07 MED ORDER — CIPROFLOXACIN-DEXAMETHASONE 0.3-0.1 % OT SUSP: 4.0000 [drp] | Freq: Two times a day (BID) | OTIC | 0 refills | Status: DC

## 2020-03-07 NOTE — Telephone Encounter (Signed)
Refill Request ciprofloxacin-dexamethasone (CIPRODEX) OTIC suspension   Pharmacy:[facility] in [place]

## 2020-03-07 NOTE — Telephone Encounter (Signed)
Sent in Ciprodex

## 2020-05-08 ENCOUNTER — Other Ambulatory Visit: Payer: Self-pay | Admitting: Neurology

## 2020-06-26 ENCOUNTER — Other Ambulatory Visit: Payer: Self-pay

## 2020-06-26 DIAGNOSIS — H66005 Acute suppurative otitis media without spontaneous rupture of ear drum, recurrent, left ear: Secondary | ICD-10-CM

## 2020-06-26 MED ORDER — CIPROFLOXACIN-DEXAMETHASONE 0.3-0.1 % OT SUSP: 4.0000 [drp] | Freq: Two times a day (BID) | OTIC | 0 refills | Status: DC

## 2020-06-26 NOTE — Telephone Encounter (Signed)
Medication refill request for the following ciprofloxacin-dexamethasone (CIPRODEX) OTIC suspension

## 2020-07-01 ENCOUNTER — Ambulatory Visit (INDEPENDENT_AMBULATORY_CARE_PROVIDER_SITE_OTHER): Payer: 59 | Admitting: Physician Assistant

## 2020-07-01 ENCOUNTER — Encounter: Payer: Self-pay | Admitting: Physician Assistant

## 2020-07-01 ENCOUNTER — Other Ambulatory Visit: Payer: Self-pay

## 2020-07-01 VITALS — BP 105/63 | HR 83 | Temp 97.5°F | Resp 16 | Wt 162.0 lb

## 2020-07-01 DIAGNOSIS — H66005 Acute suppurative otitis media without spontaneous rupture of ear drum, recurrent, left ear: Secondary | ICD-10-CM | POA: Diagnosis not present

## 2020-07-01 MED ORDER — AMOXICILLIN 500 MG PO CAPS: 500.0000 mg | ORAL_CAPSULE | Freq: Three times a day (TID) | ORAL | 0 refills | Status: DC

## 2020-07-01 NOTE — Progress Notes (Signed)
Established patient visit   Patient: Grace Garcia   DOB: 11/05/1995   23 y.o. Female  MRN: [id number] Visit Date: 07/01/2020  Today's healthcare provider: Margaretann Loveless, PA-C   Chief Complaint  Patient presents with   Seizures   Subjective    HPI  Patient had a seizure Friday. She has been having ear infections. Mother has been putting the antibiotic ear drops at home. She was doing this prior to the seizure and had thought the drops were helping. Patient normally has seizures with her ear infections.  Patient Active Problem List   Diagnosis Date Noted   Active autistic disorder 01/07/2016   Seizure (HCC) 04/28/2014   Past Medical History:  Diagnosis Date   Autistic behavior    Seizures (HCC)        Medications: Outpatient Medications Prior to Visit  Medication Sig   ciprofloxacin-dexamethasone (CIPRODEX) OTIC suspension Place 4 drops into the left ear 2 (two) times daily.   DIASTAT ACUDIAL 20 MG GEL    gabapentin (NEURONTIN) 300 MG capsule Take 600 mg by mouth 2 (two) times daily.    lamoTRIgine (LAMICTAL) 200 MG tablet Take 200 mg by mouth 2 (two) times daily. Take 200 mg in the am and 300 mg in the pm   mometasone (NASONEX) 50 MCG/ACT nasal spray PLACE 2 SPRAYS INTO THE NOSE DAILY.   norgestimate-ethinyl estradiol (MONO-LINYAH) 0.25-35 MG-MCG tablet Take 1 tablet by mouth daily. Skip placebo pills   No facility-administered medications prior to visit.    Review of Systems  Constitutional: Negative.   HENT: Negative.   Respiratory: Negative.   Cardiovascular: Negative.   Neurological: Positive for seizures. Negative for dizziness and headaches.    Last CBC Lab Results  Component Value Date   WBC 9.0 06/07/2019   HGB 12.2 06/07/2019   HCT 36.3 06/07/2019   MCV 90 06/07/2019   MCH 30.3 06/07/2019   RDW 12.5 06/07/2019   PLT 323 06/07/2019   Last metabolic panel Lab Results  Component Value Date   GLUCOSE 80 06/07/2019   NA  139 06/07/2019   K 4.5 06/07/2019   CL 105 06/07/2019   CO2 20 05/19/2017   BUN 10 06/07/2019   CREATININE 0.74 06/07/2019   GFRNONAA 114 06/07/2019   GFRAA 131 06/07/2019   CALCIUM 9.5 06/07/2019   PROT 6.7 06/07/2019   ALBUMIN 4.3 06/07/2019   LABGLOB 2.4 06/07/2019   AGRATIO 1.8 06/07/2019   BILITOT 0.3 06/07/2019   ALKPHOS 73 06/07/2019   AST 12 06/07/2019   ALT 8 05/19/2017   ANIONGAP 9 04/02/2014      Objective    BP 105/63    Pulse 83    Temp (!) 97.5 F (36.4 C) (Oral)    Resp 16    Wt 162 lb (73.5 kg)    BMI 28.70 kg/m  BP Readings from Last 3 Encounters:  07/01/20 105/63  02/22/20 119/74  01/19/20 124/80   Wt Readings from Last 3 Encounters:  07/01/20 162 lb (73.5 kg)  02/22/20 158 lb (71.7 kg)  01/19/20 160 lb (72.6 kg)      Physical Exam Vitals reviewed.  Constitutional:      General: She is not in acute distress.    Appearance: Normal appearance. She is well-developed. She is not ill-appearing or diaphoretic.  HENT:     Head: Normocephalic and atraumatic.     Right Ear: Hearing, ear canal and external ear normal. No drainage, swelling or tenderness.  No middle ear effusion. A PE tube is present. Tympanic membrane is scarred.     Left Ear: Hearing, ear canal and external ear normal. No drainage, swelling or tenderness. A middle ear effusion is present. There is no impacted cerumen. A PE tube is present. Tympanic membrane is scarred.     Nose: Nose normal.     Mouth/Throat:     Pharynx: Uvula midline. No oropharyngeal exudate.  Eyes:     General: No scleral icterus.       Right eye: No discharge.        Left eye: No discharge.     Conjunctiva/sclera: Conjunctivae normal.     Pupils: Pupils are equal, round, and reactive to light.  Neck:     Thyroid: No thyromegaly.     Trachea: No tracheal deviation.  Cardiovascular:     Rate and Rhythm: Normal rate and regular rhythm.     Heart sounds: Normal heart sounds. No murmur heard.  No friction rub. No  gallop.   Pulmonary:     Effort: Pulmonary effort is normal. No respiratory distress.     Breath sounds: Normal breath sounds. No stridor. No wheezing or rales.  Musculoskeletal:     Cervical back: Normal range of motion and neck supple.  Lymphadenopathy:     Cervical: No cervical adenopathy.  Skin:    General: Skin is warm and dry.  Neurological:     Mental Status: She is alert.       No results found for any visits on 07/01/20.  Assessment & Plan     1. Recurrent acute suppurative otitis media without spontaneous rupture of left tympanic membrane Failed antibiotic drops. Will start amoxil as below. Call if not improving or needs recheck. - amoxicillin (AMOXIL) 500 MG capsule; Take 1 capsule (500 mg total) by mouth 3 (three) times daily.  Dispense: 30 capsule; Refill: 0   No follow-ups on file.      Delmer Islam, PA-C, have reviewed all documentation for this visit. The documentation on 07/04/20 for the exam, diagnosis, procedures, and orders are all accurate and complete.   Reine Just  [facility] [phone] (phone) [phone] (fax)  [facility]

## 2020-07-04 ENCOUNTER — Encounter: Payer: Self-pay | Admitting: Physician Assistant

## 2020-07-08 ENCOUNTER — Other Ambulatory Visit: Payer: Self-pay | Admitting: Family Medicine

## 2020-07-08 NOTE — Telephone Encounter (Signed)
Requested medication (s) are due for refill today: Yes  Requested medication (s) are on the active medication list: Yes  Last refill:  07/06/19  Future visit scheduled: No  Notes to clinic:  Prescription has expired.    Requested Prescriptions  Pending Prescriptions Disp Refills   MONO-LINYAH 0.25-35 MG-MCG tablet [Pharmacy Med Name: MONO-LINYAH 28 TABLET 0.25-35 Tablet] 112 tablet 11    Sig: TAKE 1 TABLET BY MOUTH DAILY. SKIP PLACEBO PILLS      OB/GYN:  Contraceptives Passed - 07/08/2020  7:40 AM      Passed - Last BP in normal range    BP Readings from Last 1 Encounters:  07/01/20 105/63          Passed - Valid encounter within last 12 months    Recent Outpatient Visits           1 week ago Recurrent acute suppurative otitis media without spontaneous rupture of left tympanic membrane   [facility] [place], [place], [place]   4 months ago S/P tympanic tube insertion   [facility] [place], [place], [place]   5 months ago Recurrent acute suppurative otitis media without spontaneous rupture of left tympanic membrane   [facility] [place], [place], [place]   6 months ago Non-recurrent acute suppurative otitis media of left ear without spontaneous rupture of tympanic membrane   [facility] [place], [place]   12 months ago Seizure [facility])   [facility] [place], [place], [place]

## 2020-07-25 ENCOUNTER — Ambulatory Visit: Payer: Self-pay

## 2020-07-25 ENCOUNTER — Other Ambulatory Visit: Payer: Self-pay

## 2020-07-25 ENCOUNTER — Ambulatory Visit (INDEPENDENT_AMBULATORY_CARE_PROVIDER_SITE_OTHER): Payer: 59 | Admitting: Family Medicine

## 2020-07-25 ENCOUNTER — Encounter: Payer: Self-pay | Admitting: Family Medicine

## 2020-07-25 VITALS — BP 128/83 | HR 96 | Temp 98.2°F | Wt 162.0 lb

## 2020-07-25 DIAGNOSIS — H66005 Acute suppurative otitis media without spontaneous rupture of ear drum, recurrent, left ear: Secondary | ICD-10-CM | POA: Diagnosis not present

## 2020-07-25 DIAGNOSIS — G40909 Epilepsy, unspecified, not intractable, without status epilepticus: Secondary | ICD-10-CM | POA: Diagnosis not present

## 2020-07-25 MED ORDER — AMOXICILLIN-POT CLAVULANATE 875-125 MG PO TABS
1.0000 | ORAL_TABLET | Freq: Two times a day (BID) | ORAL | 0 refills | Status: AC
Start: 2020-07-25 — End: 2020-08-01

## 2020-07-25 NOTE — Patient Instructions (Signed)

## 2020-07-25 NOTE — Progress Notes (Signed)
Established patient visit   Patient: Grace Garcia   DOB: 11/30/1995   23 y.o. Female  MRN: [id number] Visit Date: 07/25/2020  Today's healthcare provider: Shirlee Latch, MD   Chief Complaint  Patient presents with  . Seizures   Subjective    Seizures  This is a chronic problem. The current episode started 1 to 2 hours ago. The problem has been rapidly improving. There was 1 seizure. The episode was witnessed. There has been no fever.    Patient declines any pains or complaints. She is almost back to baseline, but still drowsy.  Typically has an infection when these occur.    Social History   Tobacco Use  . Smoking status: Never Smoker  . Smokeless tobacco: Never Used  Substance Use Topics  . Alcohol use: No  . Drug use: No       Medications: Outpatient Medications Prior to Visit  Medication Sig  . ciprofloxacin-dexamethasone (CIPRODEX) OTIC suspension Place 4 drops into the left ear 2 (two) times daily.  Marland Kitchen DIASTAT ACUDIAL 20 MG GEL   . gabapentin (NEURONTIN) 300 MG capsule Take 600 mg by mouth 2 (two) times daily.   Marland Kitchen lamoTRIgine (LAMICTAL) 200 MG tablet Take 200 mg by mouth 2 (two) times daily. Take 200 mg in the am and 300 mg in the pm  . mometasone (NASONEX) 50 MCG/ACT nasal spray PLACE 2 SPRAYS INTO THE NOSE DAILY.  Marland Kitchen MONO-LINYAH 0.25-35 MG-MCG tablet TAKE 1 TABLET BY MOUTH DAILY. SKIP PLACEBO PILLS  . [DISCONTINUED] amoxicillin (AMOXIL) 500 MG capsule Take 1 capsule (500 mg total) by mouth 3 (three) times daily. (Patient not taking: Reported on 07/25/2020)   No facility-administered medications prior to visit.    Review of Systems  Neurological: Positive for seizures.       Objective    BP 128/83 (BP Location: Left Arm, Patient Position: Sitting)   Pulse 96   Temp 98.2 F (36.8 C) (Oral)   Wt 162 lb (73.5 kg)   BMI 28.70 kg/m     Physical Exam Constitutional:      General: She is not in acute distress.    Appearance: She is not  diaphoretic.  HENT:     Head: Normocephalic.     Comments: Small raised area on mid-scalp with no bleeding or laceration    Right Ear: Ear canal and external ear normal. A PE tube is present. Tympanic membrane is scarred.     Left Ear: Ear canal and external ear normal. No drainage. A middle ear effusion is present. No mastoid tenderness. A PE tube is present. Tympanic membrane is scarred and bulging.     Mouth/Throat:     Mouth: Mucous membranes are moist. Injury present.     Dentition: Normal dentition.     Tongue: No lesions. Tongue does not deviate from midline.     Pharynx: No oropharyngeal exudate or posterior oropharyngeal erythema.     Comments: Dried blood on R corner of lips. Dentition intact Eyes:     General: No scleral icterus.    Extraocular Movements: Extraocular movements intact.     Conjunctiva/sclera: Conjunctivae normal.     Pupils: Pupils are equal, round, and reactive to light.  Cardiovascular:     Rate and Rhythm: Normal rate and regular rhythm.     Pulses: Normal pulses.     Heart sounds: Normal heart sounds. No murmur heard.   Pulmonary:     Effort: Pulmonary effort is normal. No  respiratory distress.     Breath sounds: Normal breath sounds. No wheezing.  Abdominal:     General: There is no distension.     Palpations: Abdomen is soft.     Tenderness: There is no abdominal tenderness.  Musculoskeletal:     Right lower leg: No edema.     Left lower leg: No edema.  Neurological:     Mental Status: She is alert.     Comments: Drowsy, but alert. Answers questions appropriately.  Some jitteriness.       No results found for any visits on 07/25/20.  Assessment & Plan     1. Recurrent acute suppurative otitis media without spontaneous rupture of left tympanic membrane - exam c/w L purulent AOM - no evidence of sinusitis, CAP, strep pharyngitis, or other infection - will treat with augmentin x7d - discussed symptomatic management (flonase, tylenol, etc),  natural course, and return precautions     2. Seizure disorder (HCC) - breakthrough seizure likely 2/2 AOM as above - typically well controlled - followed by neurology - mother aware of prolonged seizure precautions and when to go to ED - almost back to baseline, but slightly post-ictal  Return if symptoms worsen or fail to improve.      I, Shirlee Latch, MD, have reviewed all documentation for this visit. The documentation on 07/25/20 for the exam, diagnosis, procedures, and orders are all accurate and complete.   Moriah Shawley, Marzella Schlein, MD, MPH [facility]

## 2020-07-25 NOTE — Telephone Encounter (Signed)
Mom reports pt. Had a 2 minute seizure at home this morning. Injured lip. Awake, tired at present. Reports she "usually has a trigger like an ear infection." Appointment made for this morning.  Reason for Disposition . Epileptic seizures occur frequently (several per week)  Answer Assessment - Initial Assessment Questions 1. ONSET: "How long did the seizure last?" (Minutes)      2 minutes 2. CONTENT: "Describe what happened during the seizure. Did the body become stiff? Was there any jerking?"      Jerking 3. CIRCUMSTANCE: "What was the individual doing when the seizure began?"      At home 4. MENTAL STATUS: "Does he know who he is, who you are, and where he is?"      Oriented 5. PRIOR SEIZURES: "Has the individual had a seizure (convulsion) before?" If Yes, ask: "When was the last time?" and "What happened last time?"     Yes 6. EPILEPSY: "Does the individual have epilepsy?" (note: check for medical ID bracelet)     Yes 7. MEDICATIONS: "Does the individual take anticonvulsant medications?" (e.g., yes/no, compliance, any recent changes)     Yes 8. INJURY: "Did the individual hurt himself during the seizure?" (e.g., head, tongue)     Lip 9. OTHER SYMPTOMS: "Are there any other symptoms?" (e.g., fever, headache)     Tired 10. PREGNANCY: "Is there any chance you are pregnant?" "When was your last menstrual period?"       No  Protocols used: [facility]

## 2020-07-31 ENCOUNTER — Other Ambulatory Visit: Payer: Self-pay | Admitting: Family Medicine

## 2020-07-31 ENCOUNTER — Other Ambulatory Visit: Payer: Self-pay | Admitting: *Deleted

## 2020-07-31 MED ORDER — MOMETASONE FUROATE 50 MCG/ACT NA SUSP: 2.0000 | Freq: Every day | NASAL | 1 refills | Status: DC

## 2020-08-06 DIAGNOSIS — R569 Unspecified convulsions: Secondary | ICD-10-CM | POA: Diagnosis not present

## 2020-08-06 DIAGNOSIS — Z79899 Other long term (current) drug therapy: Secondary | ICD-10-CM | POA: Diagnosis not present

## 2020-08-06 DIAGNOSIS — E538 Deficiency of other specified B group vitamins: Secondary | ICD-10-CM | POA: Diagnosis not present

## 2020-08-06 DIAGNOSIS — F840 Autistic disorder: Secondary | ICD-10-CM | POA: Diagnosis not present

## 2020-08-06 DIAGNOSIS — E559 Vitamin D deficiency, unspecified: Secondary | ICD-10-CM | POA: Diagnosis not present

## 2020-08-07 ENCOUNTER — Other Ambulatory Visit: Payer: Self-pay

## 2020-08-07 ENCOUNTER — Ambulatory Visit (INDEPENDENT_AMBULATORY_CARE_PROVIDER_SITE_OTHER): Payer: 59 | Admitting: Physician Assistant

## 2020-08-07 ENCOUNTER — Encounter: Payer: Self-pay | Admitting: Physician Assistant

## 2020-08-07 VITALS — BP 105/74 | HR 94 | Temp 97.7°F | Wt 163.0 lb

## 2020-08-07 DIAGNOSIS — H6612 Chronic tubotympanic suppurative otitis media, left ear: Secondary | ICD-10-CM | POA: Diagnosis not present

## 2020-08-07 DIAGNOSIS — Z23 Encounter for immunization: Secondary | ICD-10-CM

## 2020-08-07 MED ORDER — AMOXICILLIN-POT CLAVULANATE 875-125 MG PO TABS: 1.0000 | ORAL_TABLET | Freq: Two times a day (BID) | ORAL | 0 refills | Status: DC

## 2020-08-07 NOTE — Progress Notes (Signed)
Established patient visit   Patient: Grace Garcia   DOB: 1996-02-23   24 y.o. Female  MRN: [id number] Visit Date: 08/07/2020  Today's healthcare provider: Margaretann Loveless, PA-C   Chief Complaint  Patient presents with  . Follow-up  . Ear Pain   Subjective    HPI  Patient here to follow up ear infection. She was recently diagnosed with left ear infection after having a seizure. She has seen her neurologist and they have increased her Lamictal to 450mg  BID, hoping to increase seizure threshold from these infections.   Patient Active Problem List   Diagnosis Date Noted  . Active autistic disorder 01/07/2016  . Seizure (HCC) 04/28/2014   Past Medical History:  Diagnosis Date  . Autistic behavior   . Seizures (HCC)    Social History   Tobacco Use  . Smoking status: Never Smoker  . Smokeless tobacco: Never Used  Substance Use Topics  . Alcohol use: No  . Drug use: No   No Known Allergies   Medications: Outpatient Medications Prior to Visit  Medication Sig  . gabapentin (NEURONTIN) 300 MG capsule Take 600 mg by mouth 2 (two) times daily.   06/28/2014 lamoTRIgine (LAMICTAL) 150 MG tablet Take 450 mg by mouth 2 (two) times daily.   . mometasone (NASONEX) 50 MCG/ACT nasal spray Place 2 sprays into the nose daily.  Marland Kitchen MONO-LINYAH 0.25-35 MG-MCG tablet TAKE 1 TABLET BY MOUTH DAILY. SKIP PLACEBO PILLS  . ciprofloxacin-dexamethasone (CIPRODEX) OTIC suspension Place 4 drops into the left ear 2 (two) times daily. (Patient not taking: Reported on 08/07/2020)  . DIASTAT ACUDIAL 20 MG GEL  (Patient not taking: Reported on 08/07/2020)  . lamoTRIgine (LAMICTAL) 200 MG tablet Take 200 mg by mouth 2 (two) times daily. Take 200 mg in the am and 300 mg in the pm   No facility-administered medications prior to visit.    Review of Systems  Constitutional: Negative.   HENT: Negative.   Gastrointestinal: Negative.   Neurological: Positive for seizures. Negative for dizziness,  light-headedness and headaches.      Objective    BP 105/74 (BP Location: Right Arm, Patient Position: Sitting, Cuff Size: Large)   Pulse 94   Temp 97.7 F (36.5 C) (Oral)   Wt 163 lb (73.9 kg)   BMI 28.87 kg/m    Physical Exam Vitals reviewed.  Constitutional:      General: She is not in acute distress.    Appearance: Normal appearance. She is well-developed. She is not ill-appearing or diaphoretic.  HENT:     Head: Normocephalic and atraumatic.     Right Ear: Hearing, tympanic membrane, ear canal and external ear normal. No drainage or swelling. No middle ear effusion. There is no impacted cerumen. A PE tube is present. Tympanic membrane is not scarred, retracted or bulging.     Left Ear: Hearing, ear canal and external ear normal. Drainage and swelling present. A middle ear effusion (opaque) is present. There is no impacted cerumen. No mastoid tenderness. A PE tube is present. Tympanic membrane is scarred. Tympanic membrane is not retracted or bulging.     Mouth/Throat:     Pharynx: Uvula midline.  Eyes:     General: No scleral icterus.       Right eye: No discharge.        Left eye: No discharge.     Extraocular Movements: Extraocular movements intact.     Conjunctiva/sclera: Conjunctivae normal.     Pupils:  Pupils are equal, round, and reactive to light.  Neck:     Thyroid: No thyromegaly.     Trachea: No tracheal deviation.  Pulmonary:     Effort: No respiratory distress.  Musculoskeletal:     Cervical back: Normal range of motion and neck supple. No tenderness.  Lymphadenopathy:     Cervical: No cervical adenopathy.  Skin:    General: Skin is warm and dry.  Neurological:     Mental Status: She is alert.  Psychiatric:        Behavior: Behavior is cooperative.     No results found for any visits on 08/07/20.  Assessment & Plan     1. Need for immunization against influenza Will hold off on flu vaccine at this time due to ear infection not completely  cleared and patient with seizure disorder, recently having medication changes. Will f/u in 2 weeks.   2. 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 Will recheck in 2 weeks.  - amoxicillin-clavulanate (AUGMENTIN) 875-125 MG tablet; Take 1 tablet by mouth 2 (two) times daily.  Dispense: 20 tablet; Refill: 0   No follow-ups on file.      Delmer Islam, PA-C, have reviewed all documentation for this visit. The documentation on 08/13/20 for the exam, diagnosis, procedures, and orders are all accurate and complete.   Reine Just  [facility] [phone] (phone) [phone] (fax)  [facility]

## 2020-08-13 ENCOUNTER — Encounter: Payer: Self-pay | Admitting: Physician Assistant

## 2020-08-20 NOTE — Progress Notes (Signed)
Established patient visit   Patient: Grace Garcia   DOB: [birth year]   23 y.o. Female  MRN: [id number] Visit Date: 08/21/2020  Today's healthcare provider: Margaretann Loveless, PA-C   Chief Complaint  Patient presents with  . Follow-up   Subjective    HPI  Follow up for chronic tubotympanic suppurative otitis media of [place] ear  The patient was last seen for this 2 weeks ago. Changes made at last visit include Continue Augmentin.  Symptoms are seemingly better. No seizures.  -----------------------------------------------------------------------------------------   Patient Active Problem List   Diagnosis Date Noted  . Active autistic disorder 01/07/2016  . Seizure (HCC) 04/28/2014   Past Medical History:  Diagnosis Date  . Autistic behavior   . Seizures (HCC)        Medications: Outpatient Medications Prior to Visit  Medication Sig  . gabapentin (NEURONTIN) 300 MG capsule Take 600 mg by mouth 2 (two) times daily.   Marland Kitchen lamoTRIgine (LAMICTAL) 150 MG tablet Take 450 mg by mouth 2 (two) times daily.   . mometasone (NASONEX) 50 MCG/ACT nasal spray Place 2 sprays into the nose daily.  Marland Kitchen MONO-LINYAH 0.25-35 MG-MCG tablet TAKE 1 TABLET BY MOUTH DAILY. SKIP PLACEBO PILLS  . amoxicillin-clavulanate (AUGMENTIN) 875-125 MG tablet Take 1 tablet by mouth 2 (two) times daily.  Marland Kitchen DIASTAT ACUDIAL 20 MG GEL  (Patient not taking: Reported on 08/07/2020)   No facility-administered medications prior to visit.    Review of Systems  Constitutional: Negative.   HENT: Negative.   Respiratory: Negative.   Cardiovascular: Negative.   Neurological: Positive for seizures.    Last CBC Lab Results  Component Value Date   WBC 9.0 06/07/2019   HGB 12.2 06/07/2019   HCT 36.3 06/07/2019   MCV 90 06/07/2019   MCH 30.3 06/07/2019   RDW 12.5 06/07/2019   PLT 323 06/07/2019   Last metabolic panel Lab Results  Component Value Date   GLUCOSE 80 06/07/2019   NA 139 06/07/2019    K 4.5 06/07/2019   CL 105 06/07/2019   CO2 20 05/19/2017   BUN 10 06/07/2019   CREATININE 0.74 06/07/2019   GFRNONAA 114 06/07/2019   GFRAA 131 06/07/2019   CALCIUM 9.5 06/07/2019   PROT 6.7 06/07/2019   ALBUMIN 4.3 06/07/2019   LABGLOB 2.4 06/07/2019   AGRATIO 1.8 06/07/2019   BILITOT 0.3 06/07/2019   ALKPHOS 73 06/07/2019   AST 12 06/07/2019   ALT 8 05/19/2017   ANIONGAP 9 04/02/2014      Objective    BP 121/79 (BP Location: Right Arm, Patient Position: Sitting, Cuff Size: Normal)   Pulse 86   Temp 97.7 F (36.5 C) (Oral)   Resp 16   Wt 162 lb 4.8 oz (73.6 kg)   BMI 28.75 kg/m  BP Readings from Last 3 Encounters:  08/21/20 121/79  08/07/20 105/74  07/25/20 128/83   Wt Readings from Last 3 Encounters:  08/21/20 162 lb 4.8 oz (73.6 kg)  08/07/20 163 lb (73.9 kg)  07/25/20 162 lb (73.5 kg)      Physical Exam Vitals reviewed.  Constitutional:      General: She is not in acute distress.    Appearance: Normal appearance. She is well-developed. She is not ill-appearing or diaphoretic.  HENT:     Head: Normocephalic and atraumatic.     Right Ear: Hearing, ear canal and external ear normal. No tenderness. A PE tube (some cerumen starting to build up in front of  tube opening) is present. Tympanic membrane is scarred. Tympanic membrane is not erythematous or bulging.     Left Ear: Hearing, ear canal and external ear normal. No drainage, swelling or tenderness. A PE tube (tube is slightly shifted but still in place and not occluded) is present. Tympanic membrane is scarred. Tympanic membrane is not erythematous or bulging.     Mouth/Throat:     Pharynx: Uvula midline.  Eyes:     General: No scleral icterus.       Right eye: No discharge.        Left eye: No discharge.     Extraocular Movements: Extraocular movements intact.     Conjunctiva/sclera: Conjunctivae normal.     Pupils: Pupils are equal, round, and reactive to light.  Neck:     Thyroid: No thyromegaly.      Trachea: No tracheal deviation.  Cardiovascular:     Rate and Rhythm: Normal rate and regular rhythm.     Pulses: Normal pulses.     Heart sounds: Normal heart sounds. No murmur heard.  No friction rub. No gallop.   Pulmonary:     Effort: Pulmonary effort is normal. No respiratory distress.     Breath sounds: Normal breath sounds. No stridor. No wheezing or rales.  Musculoskeletal:     Cervical back: Normal range of motion and neck supple. No tenderness.  Lymphadenopathy:     Cervical: No cervical adenopathy.  Skin:    General: Skin is warm and dry.  Neurological:     Mental Status: She is alert.       No results found for any visits on 08/21/20.  Assessment & Plan     1. Recurrent acute suppurative otitis media without spontaneous rupture of left tympanic membrane Improved. Completed antibiotics. Will f/u with ENT for cerumen in the right and the slight displacement of the left tympanostomy tube.  2. Need for influenza vaccination Flu vaccine given today without complication. Patient sat upright for 15 minutes to check for adverse reaction before being released. - Flu Vaccine QUAD 36+ mos IM  No follow-ups on file.      Delmer Islam, PA-C, have reviewed all documentation for this visit. The documentation on 08/21/20 for the exam, diagnosis, procedures, and orders are all accurate and complete.   Reine Just  [facility] [phone] (phone) [phone] (fax)  [facility]

## 2020-08-21 ENCOUNTER — Ambulatory Visit (INDEPENDENT_AMBULATORY_CARE_PROVIDER_SITE_OTHER): Payer: 59 | Admitting: Physician Assistant

## 2020-08-21 ENCOUNTER — Other Ambulatory Visit: Payer: Self-pay

## 2020-08-21 ENCOUNTER — Encounter: Payer: Self-pay | Admitting: Physician Assistant

## 2020-08-21 VITALS — BP 121/79 | HR 86 | Temp 97.7°F | Resp 16 | Wt 162.3 lb

## 2020-08-21 DIAGNOSIS — Z23 Encounter for immunization: Secondary | ICD-10-CM | POA: Diagnosis not present

## 2020-08-21 DIAGNOSIS — H66005 Acute suppurative otitis media without spontaneous rupture of ear drum, recurrent, left ear: Secondary | ICD-10-CM | POA: Diagnosis not present

## 2020-09-06 ENCOUNTER — Other Ambulatory Visit: Payer: Self-pay | Admitting: Physician Assistant

## 2020-09-06 DIAGNOSIS — H6980 Other specified disorders of Eustachian tube, unspecified ear: Secondary | ICD-10-CM | POA: Diagnosis not present

## 2020-09-06 DIAGNOSIS — H6122 Impacted cerumen, left ear: Secondary | ICD-10-CM | POA: Diagnosis not present

## 2020-09-06 DIAGNOSIS — H9212 Otorrhea, left ear: Secondary | ICD-10-CM | POA: Diagnosis not present

## 2020-09-13 ENCOUNTER — Other Ambulatory Visit: Payer: Self-pay

## 2020-09-13 ENCOUNTER — Ambulatory Visit (INDEPENDENT_AMBULATORY_CARE_PROVIDER_SITE_OTHER): Payer: 59 | Admitting: Physician Assistant

## 2020-09-13 DIAGNOSIS — Z23 Encounter for immunization: Secondary | ICD-10-CM | POA: Diagnosis not present

## 2020-09-13 NOTE — Progress Notes (Signed)
Covid 19 booster Vaccine given to patient without complications. Patient sat for 15 minutes after administration and was tolerated well without adverse effects. 

## 2020-09-24 ENCOUNTER — Other Ambulatory Visit: Payer: Self-pay | Admitting: Otolaryngology

## 2020-09-24 DIAGNOSIS — H663X9 Other chronic suppurative otitis media, unspecified ear: Secondary | ICD-10-CM | POA: Diagnosis not present

## 2020-09-24 DIAGNOSIS — H6121 Impacted cerumen, right ear: Secondary | ICD-10-CM | POA: Diagnosis not present

## 2020-09-24 DIAGNOSIS — H6980 Other specified disorders of Eustachian tube, unspecified ear: Secondary | ICD-10-CM | POA: Diagnosis not present

## 2020-10-02 ENCOUNTER — Other Ambulatory Visit: Payer: Self-pay | Admitting: Physician Assistant

## 2020-10-02 MED ORDER — NORGESTIMATE-ETH ESTRADIOL 0.25-35 MG-MCG PO TABS: ORAL_TABLET | ORAL | 11 refills | Status: DC

## 2020-10-02 NOTE — Progress Notes (Signed)
OCP refilled to [facility] [place], change of insurance required pharmacy change

## 2020-10-16 ENCOUNTER — Ambulatory Visit (INDEPENDENT_AMBULATORY_CARE_PROVIDER_SITE_OTHER): Payer: 59 | Admitting: Physician Assistant

## 2020-10-16 ENCOUNTER — Encounter: Payer: Self-pay | Admitting: Physician Assistant

## 2020-10-16 ENCOUNTER — Other Ambulatory Visit: Payer: Self-pay

## 2020-10-16 VITALS — BP 102/69 | HR 88 | Temp 97.6°F | Wt 162.0 lb

## 2020-10-16 DIAGNOSIS — H66001 Acute suppurative otitis media without spontaneous rupture of ear drum, right ear: Secondary | ICD-10-CM | POA: Diagnosis not present

## 2020-10-16 NOTE — Progress Notes (Signed)
Established patient visit   Patient: Grace Garcia   DOB: 1996/07/18   25 y.o. Female  MRN: [id number] Visit Date: 10/16/2020  Today's healthcare provider: Margaretann Loveless, PA-C   No chief complaint on file.  Subjective    HPI  Patient had a seizure last week. She has been having more frequent seizures of recent. She has been having issues with recurrent ear infections as well, which most often triggers a seizure. She has seen Dr. Elenore Rota, ENT. She is scheduled to see her Neurologist about possible adjustments in medications. She has been doing drops in the left ear as this is normally where there is infection.   Patient Active Problem List   Diagnosis Date Noted  . Active autistic disorder 01/07/2016  . Seizure (HCC) 04/28/2014   Past Medical History:  Diagnosis Date  . Autistic behavior   . Seizures (HCC)        Medications: Outpatient Medications Prior to Visit  Medication Sig  . DIASTAT ACUDIAL 20 MG GEL  (Patient not taking: Reported on 08/07/2020)  . gabapentin (NEURONTIN) 300 MG capsule Take 600 mg by mouth 2 (two) times daily.   Marland Kitchen lamoTRIgine (LAMICTAL) 150 MG tablet Take 450 mg by mouth 2 (two) times daily.   . mometasone (NASONEX) 50 MCG/ACT nasal spray Place 2 sprays into the nose daily.  . norgestimate-ethinyl estradiol (MONO-LINYAH) 0.25-35 MG-MCG tablet TAKE 1 TABLET BY MOUTH DAILY. SKIP PLACEBO PILLS   No facility-administered medications prior to visit.    Review of Systems  Constitutional: Negative.   HENT: Negative.   Respiratory: Negative.   Cardiovascular: Negative.   Neurological: Positive for seizures.    Last CBC Lab Results  Component Value Date   WBC 9.0 06/07/2019   HGB 12.2 06/07/2019   HCT 36.3 06/07/2019   MCV 90 06/07/2019   MCH 30.3 06/07/2019   RDW 12.5 06/07/2019   PLT 323 06/07/2019   Last metabolic panel Lab Results  Component Value Date   GLUCOSE 80 06/07/2019   NA 139 06/07/2019   K 4.5 06/07/2019    CL 105 06/07/2019   CO2 20 05/19/2017   BUN 10 06/07/2019   CREATININE 0.74 06/07/2019   GFRNONAA 114 06/07/2019   GFRAA 131 06/07/2019   CALCIUM 9.5 06/07/2019   PROT 6.7 06/07/2019   ALBUMIN 4.3 06/07/2019   LABGLOB 2.4 06/07/2019   AGRATIO 1.8 06/07/2019   BILITOT 0.3 06/07/2019   ALKPHOS 73 06/07/2019   AST 12 06/07/2019   ALT 8 05/19/2017   ANIONGAP 9 04/02/2014       Objective    BP 102/69 (BP Location: Left Arm, Patient Position: Sitting, Cuff Size: Normal)   Pulse 88   Temp 97.6 F (36.4 C) (Oral)   Wt 162 lb (73.5 kg)   BMI 28.70 kg/m  BP Readings from Last 3 Encounters:  10/16/20 102/69  08/21/20 121/79  08/07/20 105/74   Wt Readings from Last 3 Encounters:  10/16/20 162 lb (73.5 kg)  08/21/20 162 lb 4.8 oz (73.6 kg)  08/07/20 163 lb (73.9 kg)       Physical Exam Vitals reviewed.  Constitutional:      General: She is not in acute distress.    Appearance: Normal appearance. She is well-developed and well-nourished. She is not ill-appearing or diaphoretic.  HENT:     Head: Normocephalic and atraumatic.     Right Ear: Hearing, ear canal and external ear normal. Drainage and swelling present. No tenderness.  A middle ear effusion is present. There is no impacted cerumen. No mastoid tenderness. A PE tube is present. Tympanic membrane is scarred and bulging. Tympanic membrane is not perforated or erythematous.     Left Ear: Hearing, ear canal and external ear normal. No drainage, swelling or tenderness.  No middle ear effusion. There is no impacted cerumen. No mastoid tenderness. A PE tube is present. Tympanic membrane is scarred. Tympanic membrane is not perforated, erythematous or bulging.     Nose: Nose normal.     Mouth/Throat:     Mouth: Oropharynx is clear and moist and mucous membranes are normal.     Pharynx: Uvula midline. No oropharyngeal exudate.  Eyes:     General: No scleral icterus.       Right eye: No discharge.        Left eye: No discharge.      Conjunctiva/sclera: Conjunctivae normal.     Pupils: Pupils are equal, round, and reactive to light.  Neck:     Thyroid: No thyromegaly.     Trachea: No tracheal deviation.  Cardiovascular:     Rate and Rhythm: Normal rate and regular rhythm.     Heart sounds: Normal heart sounds. No murmur heard. No friction rub. No gallop.   Pulmonary:     Effort: Pulmonary effort is normal. No respiratory distress.     Breath sounds: Normal breath sounds. No stridor. No wheezing or rales.  Musculoskeletal:     Cervical back: Normal range of motion and neck supple.  Lymphadenopathy:     Cervical: No cervical adenopathy.  Skin:    General: Skin is warm and dry.  Neurological:     Mental Status: She is alert.      No results found for any visits on 10/16/20.  Assessment & Plan     1. Non-recurrent acute suppurative otitis media of right ear without spontaneous rupture of tympanic membrane Infection noted in the right ear. Mother and patient have been advised to start using antibiotic ear drops in the right ear instead of the left. She will return in 2 weeks to recheck.    No follow-ups on file.      Delmer Islam, PA-C, have reviewed all documentation for this visit. The documentation on 10/22/20 for the exam, diagnosis, procedures, and orders are all accurate and complete.   Reine Just  [facility] [phone] (phone) [phone] (fax)  [facility]

## 2020-10-22 ENCOUNTER — Encounter: Payer: Self-pay | Admitting: Physician Assistant

## 2020-12-16 ENCOUNTER — Encounter: Payer: Self-pay | Admitting: Family Medicine

## 2020-12-16 ENCOUNTER — Ambulatory Visit (INDEPENDENT_AMBULATORY_CARE_PROVIDER_SITE_OTHER): Payer: 59 | Admitting: Family Medicine

## 2020-12-16 ENCOUNTER — Other Ambulatory Visit: Payer: Self-pay

## 2020-12-16 VITALS — BP 106/71 | HR 87 | Temp 98.1°F | Resp 16 | Wt 165.0 lb

## 2020-12-16 DIAGNOSIS — R569 Unspecified convulsions: Secondary | ICD-10-CM

## 2020-12-16 DIAGNOSIS — F840 Autistic disorder: Secondary | ICD-10-CM | POA: Diagnosis not present

## 2020-12-16 DIAGNOSIS — H6523 Chronic serous otitis media, bilateral: Secondary | ICD-10-CM | POA: Diagnosis not present

## 2020-12-16 NOTE — Progress Notes (Signed)
I,[name],acting as a scribe for Megan Mans, MD.,have documented all relevant documentation on the behalf of Megan Mans, MD,as directed by  Megan Mans, MD while in the presence of Megan Mans, MD.   Established patient visit   Patient: Grace Garcia   DOB: 01/27/1996   25 y.o. Female  MRN: [id number] Visit Date: 12/16/2020  Today's healthcare provider: Megan Mans, MD   No chief complaint on file.  Subjective    HPI  Patient is here to get her ears checked by provider. Seizure medication has been adjusted by neurology and mother thinks that this is the cause of her irritability recently but sometimes in the past her ear infections have caused  the irritability.  She has had no fever chills or any upper respiratory symptoms of infection.  She tells me today that her ears do not hurt.      Medications: Outpatient Medications Prior to Visit  Medication Sig  . cloBAZam (ONFI) 10 MG tablet Take 5 mg by mouth 2 (two) times daily.  Marland Kitchen gabapentin (NEURONTIN) 300 MG capsule Take 600 mg by mouth 2 (two) times daily.   Marland Kitchen lamoTRIgine (LAMICTAL) 150 MG tablet Take 300 mg by mouth 2 (two) times daily.  . mometasone (NASONEX) 50 MCG/ACT nasal spray Place 2 sprays into the nose daily.  . norgestimate-ethinyl estradiol (MONO-LINYAH) 0.25-35 MG-MCG tablet TAKE 1 TABLET BY MOUTH DAILY. SKIP PLACEBO PILLS  . DIASTAT ACUDIAL 20 MG GEL  (Patient not taking: No sig reported)   No facility-administered medications prior to visit.    Review of Systems  Constitutional: Negative for appetite change, chills, fatigue and fever.  Respiratory: Negative for chest tightness and shortness of breath.   Cardiovascular: Negative for chest pain and palpitations.  Gastrointestinal: Negative for abdominal pain, nausea and vomiting.  Neurological: Negative for dizziness and weakness.        Objective    BP 106/71 (BP Location: Right Arm, Patient Position:  Sitting, Cuff Size: Large)   Pulse 87   Temp 98.1 F (36.7 C) (Oral)   Resp 16   Wt 165 lb (74.8 kg)   SpO2 98%   BMI 29.23 kg/m      Physical Exam Vitals reviewed.  HENT:     Head: Normocephalic and atraumatic.     Right Ear: Tympanic membrane and external ear normal.     Left Ear: Tympanic membrane and external ear normal.     Ears:     Comments: Bilateral tubes in place.  Surrounding TMs appear normal With some serous fluid behind the TM No drainage from tubes.  Eyes:     General: No scleral icterus.    Conjunctiva/sclera: Conjunctivae normal.  Cardiovascular:     Rate and Rhythm: Normal rate and regular rhythm.     Pulses: Normal pulses.     Heart sounds: Normal heart sounds.  Pulmonary:     Breath sounds: Normal breath sounds.  Abdominal:     Palpations: Abdomen is soft.  Musculoskeletal:     Right lower leg: No edema.     Left lower leg: No edema.  Skin:    General: Skin is warm and dry.  Neurological:     Mental Status: She is alert. Mental status is at baseline.  Psychiatric:        Mood and Affect: Mood normal.       No results found for any visits on 12/16/20.  Assessment & Plan  1. Active autistic disorder   2. Seizure [facility]) Seizure medication being adjusted by neurology.  3. Bilateral chronic serous otitis media Ergotamine tubes in place with no signs of acute illness.   No follow-ups on file.      I, Megan Mans, MD, have reviewed all documentation for this visit. The documentation on 12/17/20 for the exam, diagnosis, procedures, and orders are all accurate and complete.    Brewster Wolters Wendelyn Breslow, MD  [facility] [phone] (phone) [phone] (fax)  [facility]

## 2020-12-17 ENCOUNTER — Ambulatory Visit: Payer: 59 | Admitting: Family Medicine

## 2020-12-19 ENCOUNTER — Encounter: Payer: Self-pay | Admitting: Physician Assistant

## 2020-12-19 ENCOUNTER — Other Ambulatory Visit: Payer: Self-pay

## 2020-12-19 ENCOUNTER — Ambulatory Visit (INDEPENDENT_AMBULATORY_CARE_PROVIDER_SITE_OTHER): Payer: 59 | Admitting: Physician Assistant

## 2020-12-19 VITALS — BP 108/70 | HR 82 | Temp 97.7°F | Ht 63.0 in | Wt 164.5 lb

## 2020-12-19 DIAGNOSIS — Z79899 Other long term (current) drug therapy: Secondary | ICD-10-CM | POA: Diagnosis not present

## 2020-12-19 DIAGNOSIS — F840 Autistic disorder: Secondary | ICD-10-CM | POA: Diagnosis not present

## 2020-12-19 DIAGNOSIS — Z0000 Encounter for general adult medical examination without abnormal findings: Secondary | ICD-10-CM | POA: Diagnosis not present

## 2020-12-19 DIAGNOSIS — R569 Unspecified convulsions: Secondary | ICD-10-CM

## 2020-12-19 DIAGNOSIS — E538 Deficiency of other specified B group vitamins: Secondary | ICD-10-CM

## 2020-12-19 NOTE — Patient Instructions (Signed)
Preventive Care 21-27 Years Old, Female Preventive care refers to lifestyle choices and visits with your health care provider that can promote health and wellness. This includes:  A yearly physical exam. This is also called an annual wellness visit.  Regular dental and eye exams.  Immunizations.  Screening for certain conditions.  Healthy lifestyle choices, such as: ? Eating a healthy diet. ? Getting regular exercise. ? Not using drugs or products that contain nicotine and tobacco. ? Limiting alcohol use. What can I expect for my preventive care visit? Physical exam Your health care provider may check your:  Height and weight. These may be used to calculate your BMI (body mass index). BMI is a measurement that tells if you are at a healthy weight.  Heart rate and blood pressure.  Body temperature.  Skin for abnormal spots. Counseling Your health care provider may ask you questions about your:  Past medical problems.  Family's medical history.  Alcohol, tobacco, and drug use.  Emotional well-being.  Home life and relationship well-being.  Sexual activity.  Diet, exercise, and sleep habits.  Work and work environment.  Access to firearms.  Method of birth control.  Menstrual cycle.  Pregnancy history. What immunizations do I need? Vaccines are usually given at various ages, according to a schedule. Your health care provider will recommend vaccines for you based on your age, medical history, and lifestyle or other factors, such as travel or where you work.   What tests do I need? Blood tests  Lipid and cholesterol levels. These may be checked every 5 years starting at age 20.  Hepatitis C test.  Hepatitis B test. Screening  Diabetes screening. This is done by checking your blood sugar (glucose) after you have not eaten for a while (fasting).  STD (sexually transmitted disease) testing, if you are at risk.  BRCA-related cancer screening. This may be  done if you have a family history of breast, ovarian, tubal, or peritoneal cancers.  Pelvic exam and Pap test. This may be done every 3 years starting at age 21. Starting at age 30, this may be done every 5 years if you have a Pap test in combination with an HPV test. Talk with your health care provider about your test results, treatment options, and if necessary, the need for more tests.   Follow these instructions at home: Eating and drinking  Eat a healthy diet that includes fresh fruits and vegetables, whole grains, lean protein, and low-fat dairy products.  Take vitamin and mineral supplements as recommended by your health care provider.  Do not drink alcohol if: ? Your health care provider tells you not to drink. ? You are pregnant, may be pregnant, or are planning to become pregnant.  If you drink alcohol: ? Limit how much you have to 0-1 drink a day. ? Be aware of how much alcohol is in your drink. In the [place] one drink equals one 12 oz bottle of beer (355 mL), one 5 oz glass of wine (148 mL), or one 1 oz glass of hard liquor (44 mL).   Lifestyle  Take daily care of your teeth and gums. Brush your teeth every morning and night with fluoride toothpaste. Floss one time each day.  Stay active. Exercise for at least 30 minutes 5 or more days each week.  Do not use any products that contain nicotine or tobacco, such as cigarettes, e-cigarettes, and chewing tobacco. If you need help quitting, ask your health care provider.  Do not   use drugs.  If you are sexually active, practice safe sex. Use a condom or other form of protection to prevent STIs (sexually transmitted infections).  If you do not wish to become pregnant, use a form of birth control. If you plan to become pregnant, see your health care provider for a prepregnancy visit.  Find healthy ways to cope with stress, such as: ? Meditation, yoga, or listening to music. ? Journaling. ? Talking to a trusted  person. ? Spending time with friends and family. Safety  Always wear your seat belt while driving or riding in a vehicle.  Do not drive: ? If you have been drinking alcohol. Do not ride with someone who has been drinking. ? When you are tired or distracted. ? While texting.  Wear a helmet and other protective equipment during sports activities.  If you have firearms in your house, make sure you follow all gun safety procedures.  Seek help if you have been physically or sexually abused. What's next?  Go to your health care provider once a year for an annual wellness visit.  Ask your health care provider how often you should have your eyes and teeth checked.  Stay up to date on all vaccines. This information is not intended to replace advice given to you by your health care provider. Make sure you discuss any questions you have with your health care provider. Document Revised: 05/12/2020 Document Reviewed: 05/26/2018 Elsevier Patient Education  2021 Elsevier Inc.  

## 2020-12-19 NOTE — Progress Notes (Signed)
Complete physical exam   Patient: Grace Garcia   DOB: 09-29-1995   25 y.o. Female  MRN: [id number] Visit Date: 12/19/2020  Today's healthcare provider: Mar Daring, PA-C   Chief Complaint  Patient presents with  . Annual Exam   Subjective    Grace Garcia is a 25 y.o. female who presents today for a complete physical exam.  She reports consuming a general diet. Home exercise routine includes walking. She generally feels fairly well. She reports sleeping well. She does not have additional problems to discuss today.  HPI    Past Medical History:  Diagnosis Date  . Autistic behavior   . Seizures ([place])    Past Surgical History:  Procedure Laterality Date  . ASD REPAIR  2002  . CARDIAC CATHETERIZATION     to repair heart defect with mesh  . MYRINGOTOMY WITH TUBE PLACEMENT     Social History   Socioeconomic History  . Marital status: Single    Spouse name: Not on file  . Number of children: Not on file  . Years of education: Not on file  . Highest education level: Not on file  Occupational History  . Not on file  Tobacco Use  . Smoking status: Never Smoker  . Smokeless tobacco: Never Used  Substance and Sexual Activity  . Alcohol use: No  . Drug use: No  . Sexual activity: Never  Other Topics Concern  . Not on file  Social History Narrative  . Not on file   Social Determinants of Health   Financial Resource Strain: Not on file  Food Insecurity: Not on file  Transportation Needs: Not on file  Physical Activity: Not on file  Stress: Not on file  Social Connections: Not on file  Intimate Partner Violence: Not on file   Family Status  Relation Name Status  . Mother  Alive  . Father  Alive  . Sister  Alive  . Brother  Alive  . PGF  Deceased  . MGF  (Not Specified)   Family History  Problem Relation Age of Onset  . ADD / ADHD Brother   . Heart disease Paternal Grandfather   . Diabetes Maternal Grandfather   . Hypertension  Maternal Grandfather    No Known Allergies  Patient Care Team: Jerrol Banana., MD as PCP - General (Family Medicine)   Medications: Outpatient Medications Prior to Visit  Medication Sig  . cloBAZam (ONFI) 10 MG tablet Take 5 mg by mouth 2 (two) times daily.  Marland Kitchen DIASTAT ACUDIAL 20 MG GEL   . gabapentin (NEURONTIN) 300 MG capsule Take 600 mg by mouth 2 (two) times daily.   Marland Kitchen lamoTRIgine (LAMICTAL) 150 MG tablet Take 300 mg by mouth 2 (two) times daily.  . mometasone (NASONEX) 50 MCG/ACT nasal spray Place 2 sprays into the nose daily.  . norgestimate-ethinyl estradiol (MONO-LINYAH) 0.25-35 MG-MCG tablet TAKE 1 TABLET BY MOUTH DAILY. SKIP PLACEBO PILLS   No facility-administered medications prior to visit.    Review of Systems  Constitutional: Negative.   HENT: Negative.   Eyes: Negative.   Respiratory: Negative.   Cardiovascular: Negative.   Gastrointestinal: Negative.   Endocrine: Negative.   Genitourinary: Negative.   Musculoskeletal: Negative.   Skin: Negative.   Allergic/Immunologic: Negative.   Neurological: Positive for seizures.  Hematological: Negative.   Psychiatric/Behavioral: Negative.     Last CBC Lab Results  Component Value Date   WBC 6.3 12/19/2020   HGB 12.2  12/19/2020   HCT 37.0 12/19/2020   MCV 90 12/19/2020   MCH 29.6 12/19/2020   RDW 12.8 12/19/2020   PLT 302 34/28/7681   Last metabolic panel Lab Results  Component Value Date   GLUCOSE 79 12/19/2020   NA 143 12/19/2020   K 4.3 12/19/2020   CL 106 12/19/2020   CO2 18 (L) 12/19/2020   BUN 8 12/19/2020   CREATININE 0.76 12/19/2020   GFRNONAA 114 06/07/2019   GFRAA 131 06/07/2019   CALCIUM 9.4 12/19/2020   PROT 7.4 12/19/2020   ALBUMIN 4.4 12/19/2020   LABGLOB 3.0 12/19/2020   AGRATIO 1.5 12/19/2020   BILITOT 0.3 12/19/2020   ALKPHOS 92 12/19/2020   AST 11 12/19/2020   ALT 10 12/19/2020   ANIONGAP 9 04/02/2014      Objective    BP 108/70 (BP Location: Left Arm, Patient  Position: Sitting, Cuff Size: Normal)   Pulse 82   Temp 97.7 F (36.5 C) (Temporal)   Ht _0  (1.6 m)   Wt 164 lb 8 oz (74.6 kg)   BMI 29.14 kg/m  BP Readings from Last 3 Encounters:  12/19/20 108/70  12/16/20 106/71  10/16/20 102/69   Wt Readings from Last 3 Encounters:  12/19/20 164 lb 8 oz (74.6 kg)  12/16/20 165 lb (74.8 kg)  10/16/20 162 lb (73.5 kg)      Physical Exam Vitals reviewed.  Constitutional:      General: She is not in acute distress.    Appearance: Normal appearance. She is well-developed. She is not ill-appearing or diaphoretic.  HENT:     Head: Normocephalic and atraumatic.     Right Ear: Hearing, ear canal and external ear normal. A PE tube is present. Tympanic membrane is scarred.     Left Ear: Hearing, ear canal and external ear normal. A PE tube is present. Tympanic membrane is scarred.     Nose: Nose normal.     Mouth/Throat:     Mouth: Mucous membranes are moist.     Pharynx: Oropharynx is clear. No oropharyngeal exudate.     Comments: Cleft palate Eyes:     General: No scleral icterus.       Right eye: No discharge.        Left eye: No discharge.     Extraocular Movements: Extraocular movements intact.     Conjunctiva/sclera: Conjunctivae normal.     Pupils: Pupils are equal, round, and reactive to light.  Neck:     Thyroid: No thyromegaly.     Vascular: No JVD.     Trachea: No tracheal deviation.  Cardiovascular:     Rate and Rhythm: Normal rate and regular rhythm.     Pulses: Normal pulses.     Heart sounds: Normal heart sounds. No murmur heard. No friction rub. No gallop.   Pulmonary:     Effort: Pulmonary effort is normal. No respiratory distress.     Breath sounds: Normal breath sounds. No wheezing or rales.  Chest:     Chest wall: No tenderness.  Abdominal:     General: Abdomen is flat. Bowel sounds are normal. There is no distension.     Palpations: Abdomen is soft. There is no mass.     Tenderness: There is no abdominal  tenderness. There is no guarding or rebound.  Musculoskeletal:        General: No tenderness. Normal range of motion.     Cervical back: Normal range of motion and neck supple. No tenderness.  Lymphadenopathy:     Cervical: No cervical adenopathy.  Skin:    General: Skin is warm and dry.     Findings: No rash.  Neurological:     Mental Status: She is alert and oriented to person, place, and time.  Psychiatric:        Mood and Affect: Mood normal.        Behavior: Behavior normal.        Thought Content: Thought content normal.        Judgment: Judgment normal.       Last depression screening scores PHQ 2/9 Scores 12/19/2020 07/25/2020 07/14/2019  PHQ - 2 Score 0 0 0  PHQ- 9 Score 0 0 0  Exception Documentation - - -   Last fall risk screening Fall Risk  12/19/2020  Falls in the past year? 1  Number falls in past yr: 1  Injury with Fall? 1  Risk for fall due to : History of fall(s)  Follow up Falls prevention discussed   Last Audit-C alcohol use screening Alcohol Use Disorder Test (AUDIT) 12/19/2020  1. How often do you have a drink containing alcohol? 0  2. How many drinks containing alcohol do you have on a typical day when you are drinking? 0  3. How often do you have six or more drinks on one occasion? 0  AUDIT-C Score 0  Alcohol Brief Interventions/Follow-up AUDIT Score <7 follow-up not indicated   A score of 3 or more in women, and 4 or more in men indicates increased risk for alcohol abuse, EXCEPT if all of the points are from question 1   No results found for any visits on 12/19/20.  Assessment & Plan    Routine Health Maintenance and Physical Exam  Exercise Activities and Dietary recommendations Goals   None     Immunization History  Administered Date(s) Administered  . DTaP 11/30/1994, 02/02/1995, 04/05/1995, 05/02/1996, 04/28/1999  . HPV Quadrivalent 01/27/2011, 02/16/2012, 07/19/2012  . Hepatitis A 06/07/2007, 07/10/2008  . Hepatitis B 1996-01[birth year],  10/29/1994, 06/16/1995  . HiB (PRP-OMP) 11/30/1994, 02/02/1995, 04/05/1995, 01/04/1996  . IPV 11/30/1994, 02/02/1995, 04/05/1995, 04/28/1999  . Influenza,inj,Quad PF,6+ Mos 06/20/2016, 06/05/2017, 07/16/2018, 06/01/2019, 08/21/2020  . Influenza-Unspecified 07/04/2015  . MMR 05/02/1996, 04/28/1999  . Meningococcal Conjugate 01/27/2011  . PFIZER(Purple Top)SARS-COV-2 Vaccination 11/19/2019, 12/12/2019, 09/13/2020  . Tdap 06/07/2007, 10/16/2018  . Varicella 04/28/1999, 06/07/2007    Health Maintenance  Topic Date Due  . Hepatitis C Screening  Never done  . HIV Screening  Never done  . PAP-Cervical Cytology Screening  01/18/2021 (Originally 09/27/2015)  . TETANUS/TDAP  10/16/2028  . INFLUENZA VACCINE  Completed  . HPV VACCINES  Completed  . COVID-19 Vaccine  Completed  . PAP SMEAR-Modifier  Discontinued    Discussed health benefits of physical activity, and encouraged her to engage in regular exercise appropriate for her age and condition.  1. Annual physical exam Normal physical exam today. Will check labs as below and f/u pending lab results. If labs are stable and WNL she will not need to have these rechecked for one year at her next annual physical exam. She is to call the office in the meantime if she has any acute issue, questions or concerns. - CBC w/Diff/Platelet - Comprehensive Metabolic Panel (CMET) - TSH - Lipid Panel With LDL/HDL Ratio - HgB A1c - Lamotrigine level - B12  2. Active autistic disorder Stable. Will check labs as below and f/u pending results. - CBC w/Diff/Platelet - Comprehensive Metabolic Panel (CMET) -  TSH - Lipid Panel With LDL/HDL Ratio - HgB A1c - Lamotrigine level - B12  3. Seizure ([place]) Stable. Followed by Neuro. Will check labs as below and f/u pending results. - CBC w/Diff/Platelet - Comprehensive Metabolic Panel (CMET) - TSH - Lipid Panel With LDL/HDL Ratio - HgB A1c - Lamotrigine level - B12  4. High risk medication use Will  check labs as below and f/u pending results. - CBC w/Diff/Platelet - Comprehensive Metabolic Panel (CMET) - TSH - Lipid Panel With LDL/HDL Ratio - HgB A1c - Lamotrigine level - B12  5. B12 deficiency Will check labs as below and f/u pending results. - CBC w/Diff/Platelet - Comprehensive Metabolic Panel (CMET) - TSH - Lipid Panel With LDL/HDL Ratio - HgB A1c - Lamotrigine level - B12   No follow-ups on file.     Reynolds Bowl, PA-C, have reviewed all documentation for this visit. The documentation on 01/03/21 for the exam, diagnosis, procedures, and orders are all accurate and complete.    Rubye Beach  [facility] [phone] (phone) [phone] (fax)  [place]

## 2020-12-20 LAB — COMPREHENSIVE METABOLIC PANEL
ALT: 10 IU/L (ref 0–32)
AST: 11 IU/L (ref 0–40)
Albumin/Globulin Ratio: 1.5 (ref 1.2–2.2)
Albumin: 4.4 g/dL (ref 3.9–5.0)
Alkaline Phosphatase: 92 IU/L (ref 44–121)
BUN/Creatinine Ratio: 11 (ref 9–23)
BUN: 8 mg/dL (ref 6–20)
Bilirubin Total: 0.3 mg/dL (ref 0.0–1.2)
CO2: 18 mmol/L — ABNORMAL LOW (ref 20–29)
Calcium: 9.4 mg/dL (ref 8.7–10.2)
Chloride: 106 mmol/L (ref 96–106)
Creatinine, Ser: 0.76 mg/dL (ref 0.57–1.00)
Globulin, Total: 3 g/dL (ref 1.5–4.5)
Glucose: 79 mg/dL (ref 65–99)
Potassium: 4.3 mmol/L (ref 3.5–5.2)
Sodium: 143 mmol/L (ref 134–144)
Total Protein: 7.4 g/dL (ref 6.0–8.5)
eGFR: 111 mL/min/{1.73_m2} (ref >59)

## 2020-12-20 LAB — CBC WITH DIFFERENTIAL/PLATELET
Basophils Absolute: 0 10*3/uL (ref 0.0–0.2)
Basos: 1 %
EOS (ABSOLUTE): 0.1 10*3/uL (ref 0.0–0.4)
Eos: 1 %
Hematocrit: 37 % (ref 34.0–46.6)
Hemoglobin: 12.2 g/dL (ref 11.1–15.9)
Immature Grans (Abs): 0 10*3/uL (ref 0.0–0.1)
Immature Granulocytes: 0 %
Lymphocytes Absolute: 2.4 10*3/uL (ref 0.7–3.1)
Lymphs: 38 %
MCH: 29.6 pg (ref 26.6–33.0)
MCHC: 33 g/dL (ref 31.5–35.7)
MCV: 90 fL (ref 79–97)
Monocytes Absolute: 0.3 10*3/uL (ref 0.1–0.9)
Monocytes: 5 %
Neutrophils Absolute: 3.4 10*3/uL (ref 1.4–7.0)
Neutrophils: 55 %
Platelets: 302 10*3/uL (ref 150–450)
RBC: 4.12 x10E6/uL (ref 3.77–5.28)
RDW: 12.8 % (ref 11.7–15.4)
WBC: 6.3 10*3/uL (ref 3.4–10.8)

## 2020-12-20 LAB — LIPID PANEL WITH LDL/HDL RATIO
Cholesterol, Total: 225 mg/dL — ABNORMAL HIGH (ref 100–199)
HDL: 58 mg/dL (ref >39)
LDL Chol Calc (NIH): 130 mg/dL — ABNORMAL HIGH (ref 0–99)
LDL/HDL Ratio: 2.2 ratio (ref 0.0–3.2)
Triglycerides: 210 mg/dL — ABNORMAL HIGH (ref 0–149)
VLDL Cholesterol Cal: 37 mg/dL (ref 5–40)

## 2020-12-20 LAB — VITAMIN B12: Vitamin B-12: 711 pg/mL (ref 232–1245)

## 2020-12-20 LAB — HEMOGLOBIN A1C
Est. average glucose Bld gHb Est-mCnc: 97 mg/dL
Hgb A1c MFr Bld: 5 % (ref 4.8–5.6)

## 2020-12-20 LAB — TSH: TSH: 2.63 u[IU]/mL (ref 0.450–4.500)

## 2020-12-20 LAB — LAMOTRIGINE LEVEL: Lamotrigine Lvl: 9 ug/mL (ref 2.0–20.0)

## 2020-12-20 NOTE — Progress Notes (Signed)
Labs reviewed via My Chart

## 2021-02-13 ENCOUNTER — Other Ambulatory Visit: Payer: Self-pay

## 2021-02-13 ENCOUNTER — Ambulatory Visit
Admission: EM | Admit: 2021-02-13 | Discharge: 2021-02-13 | Disposition: A | Discharge status: Home / Self Care | Payer: 59 | Attending: Sports Medicine | Admitting: Sports Medicine

## 2021-02-13 DIAGNOSIS — H6123 Impacted cerumen, bilateral: Secondary | ICD-10-CM | POA: Diagnosis not present

## 2021-02-13 DIAGNOSIS — R569 Unspecified convulsions: Secondary | ICD-10-CM | POA: Diagnosis not present

## 2021-02-13 DIAGNOSIS — R5383 Other fatigue: Secondary | ICD-10-CM

## 2021-02-13 DIAGNOSIS — F05 Delirium due to known physiological condition: Secondary | ICD-10-CM | POA: Diagnosis not present

## 2021-02-13 MED ORDER — CIPROFLOXACIN-DEXAMETHASONE 0.3-0.1 % OT SUSP: 4.0000 [drp] | Freq: Three times a day (TID) | OTIC | 0 refills | Status: DC | PRN

## 2021-02-13 NOTE — ED Provider Notes (Signed)
[facility]    CSN: [id number] Arrival date & time: 02/13/21  1048      History   Chief Complaint Chief Complaint  Patient presents with  . Ear Pain    HPI Grace Garcia is a 24 y.o. female.   Patient is a 25 year old female who presents with her mother for evaluation of the above issue.  Patient has autistic and has a seizure disorder.  Followed by Dr. Manuella Ghazi over at [facility] clinic neurology for this.  Primary care needs met by Dr. Rosanna Randy at [facility] family practice.  Mom reports that the patient had a seizure last night.  She has a known seizure disorder.  It was a full grand mal seizure.  She did have postictal confusion and lethargy.  She was also upset.  Today she is not herself and she is more tired.  Otherwise her behavior is appropriate.  Mom reports that in the past when she has a seizure it can be due to an ear infection and mom wanted her evaluated.  Mom did not give any medications to break the seizure and it broke within 2 to 3 minutes.  No fever shakes chills.  No nausea vomiting diarrhea.  No urinary or abdominal symptoms.  She has been eating well and has good urine output.  Her appetite is good.  No chest pain shortness of breath.  No COVID symptoms.  No red flag signs or symptoms elicited on history.     Past Medical History:  Diagnosis Date  . Autistic behavior   . Seizures [facility])     Patient Active Problem List   Diagnosis Date Noted  . B12 deficiency 12/19/2020  . High risk medication use 12/19/2020  . Active autistic disorder 01/07/2016  . Seizure ([place]) 04/28/2014    Past Surgical History:  Procedure Laterality Date  . ASD REPAIR  2002  . CARDIAC CATHETERIZATION     to repair heart defect with mesh  . MYRINGOTOMY WITH TUBE PLACEMENT      OB History   No obstetric history on file.      Home Medications    Prior to Admission medications   Medication Sig Start Date End Date Taking? Authorizing Provider   ciprofloxacin-dexamethasone (CIPRODEX) OTIC suspension Place 4 drops into both ears 3 (three) times daily as needed. 02/13/21  Yes Verda Cumins, MD  cloBAZam (ONFI) 10 MG tablet Take 5 mg by mouth 2 (two) times daily. 12/01/20  Yes [provider]  DIASTAT ACUDIAL 20 MG GEL  08/03/16  Yes [provider]  gabapentin (NEURONTIN) 300 MG capsule Take 600 mg by mouth 2 (two) times daily.    Yes [provider]  gabapentin (NEURONTIN) 300 MG capsule TAKE 2 CAPSULES (600 MG) BY MOUTH 2 TIMES DAILY 05/08/20 05/08/21 Yes Konz, Autumn Kari, PA-C  lamoTRIgine (LAMICTAL) 150 MG tablet Take 300 mg by mouth 2 (two) times daily. 08/06/20 08/06/21 Yes [provider]  mometasone (NASONEX) 50 MCG/ACT nasal spray PLACE 2 SPRAYS INTO THE NOSE DAILY. 07/31/20 07/31/21 Yes Jerrol Banana., MD  norgestimate-ethinyl estradiol [facility]) 0.25-35 MG-MCG tablet TAKE 1 TABLET BY MOUTH DAILY. SKIP PLACEBO PILLS 10/02/20  Yes Mar Daring, PA-C    Family History Family History  Problem Relation Age of Onset  . ADD / ADHD Brother   . Heart disease Paternal Grandfather   . Diabetes Maternal Grandfather   . Hypertension Maternal Grandfather     Social History Social History   Tobacco Use  .  Smoking status: Never Smoker  . Smokeless tobacco: Never Used  Vaping Use  . Vaping Use: Never used  Substance Use Topics  . Alcohol use: No  . Drug use: No     Allergies   Patient has no known allergies.   Review of Systems Review of Systems  Constitutional: Negative for activity change, appetite change, chills, diaphoresis, fatigue and fever.  HENT: Negative for congestion, ear pain, postnasal drip, rhinorrhea, sinus pressure, sinus pain, sneezing and sore throat.   Eyes: Negative for pain.  Respiratory: Negative for cough, chest tightness and shortness of breath.   Cardiovascular: Negative for chest pain and palpitations.  Gastrointestinal: Negative for abdominal  pain, diarrhea, nausea and vomiting.  Genitourinary: Negative for dysuria.  Musculoskeletal: Negative for back pain, myalgias, neck pain and neck stiffness.  Skin: Negative for color change, pallor, rash and wound.  Neurological: Positive for seizures. Negative for dizziness, syncope, light-headedness and headaches.  All other systems reviewed and are negative.    Physical Exam Triage Vital Signs ED Triage Vitals  Enc Vitals Group     BP 02/13/21 1110 109/68     Pulse Rate 02/13/21 1110 81     Resp 02/13/21 1110 18     Temp --      Temp src --      SpO2 02/13/21 1110 100 %     Weight 02/13/21 1107 164 lb 6 oz (74.6 kg)     Height 02/13/21 1107 5' 3"  (1.6 m)     Head Circumference --      Peak Flow --      Pain Score --      Pain Loc --      Pain Edu? --      Excl. in [place]? --    No data found.  Updated Vital Signs BP 109/68 (BP Location: Left Arm)   Pulse 81   Resp 18   Ht 5' 3"  (1.6 m)   Wt 74.6 kg   SpO2 100%   BMI 29.12 kg/m   Visual Acuity Right Eye Distance:   Left Eye Distance:   Bilateral Distance:    Right Eye Near:   Left Eye Near:    Bilateral Near:     Physical Exam Vitals and nursing note reviewed.  Constitutional:      General: She is not in acute distress.    Appearance: Normal appearance. She is not ill-appearing, toxic-appearing or diaphoretic.  HENT:     Head: Normocephalic and atraumatic.     Right Ear: There is impacted cerumen.     Left Ear: There is impacted cerumen.     Ears:     Comments: An ear tube is mildly visible in the cerumen on the right.  I cannot appreciate a tube on the left.  There is no erythema of the external ear or the external auditory canal.    Nose: Nose normal. No congestion or rhinorrhea.     Mouth/Throat:     Mouth: Mucous membranes are moist.  Eyes:     General: No scleral icterus.       Right eye: No discharge.        Left eye: No discharge.     Extraocular Movements: Extraocular movements intact.      Conjunctiva/sclera: Conjunctivae normal.     Pupils: Pupils are equal, round, and reactive to light.     Comments: There is some mild ptosis noted of the left eye.  This is baseline for her  according to mom.  Cardiovascular:     Rate and Rhythm: Normal rate and regular rhythm.     Pulses: Normal pulses.     Heart sounds: Normal heart sounds. No murmur heard. No friction rub. No gallop.   Pulmonary:     Effort: Pulmonary effort is normal. No respiratory distress.     Breath sounds: Normal breath sounds. No stridor. No wheezing, rhonchi or rales.  Abdominal:     General: There is no distension.     Palpations: Abdomen is soft.     Tenderness: There is no abdominal tenderness.  Musculoskeletal:     Cervical back: Normal range of motion and neck supple. No rigidity or tenderness.  Lymphadenopathy:     Cervical: No cervical adenopathy.  Skin:    General: Skin is warm and dry.     Capillary Refill: Capillary refill takes less than 2 seconds.     Findings: No bruising, erythema, lesion or rash.  Neurological:     General: No focal deficit present.     Mental Status: She is alert and oriented to person, place, and time.      UC Treatments / Results  Labs (all labs ordered are listed, but only abnormal results are displayed) Labs Reviewed - No data to display  EKG   Radiology No results found.  Procedures Procedures (including critical care time)  Medications Ordered in UC Medications - No data to display  Initial Impression / Assessment and Plan / UC Course  I have reviewed the triage vital signs and the nursing notes.  Pertinent labs & imaging results that were available during my care of the patient were reviewed by me and considered in my medical decision making (see chart for details).  Clinical impression: 25 year old autistic female with a known seizure disorder.  She did have a seizure last night.  Mom is concerned she may have an ear infection.  I cannot fully  visualize the tympanic membrane due to impacted cerumen.  She does see ENT and does have ear tubes according to mom.  I can only visualize one on the right and its only minimally visualized in the impacted cerumen.  She does have eardrops that mom uses when she has ear issues.  She does not currently have any and has no refills.  Treatment plan: 1.  The findings and treatment plan were discussed in detail with the mom.  She was in agreement. 2.  I cannot be sure she does not have an ear infection because I cannot visualize the tympanic membrane bilaterally.  Given her autism though to do a procedure for the cerumen impaction would not be tolerated well according to mom.  I elected to go ahead and just renew her ear medication.  Sent in a prescription for Ciprodex to the pharmacy.  She can use that if she has any further seizure activity. 3.  I want her to contact Dr. Alben Spittle office and give them an update. 4.  I also want him to contact Dr. Trena Platt office and let them know that she did have a seizure and see if any further treatment is recommended. 5.  Educational handouts provided. 6.  If she has any further activity and cannot reach Dr. Trena Platt office then I have advised mom to take the patient to the emergency room. 7.  She was discharged from care in stable condition and will follow-up here as needed.    Final Clinical Impressions(s) / UC Diagnoses   Final diagnoses:  Seizures ([place])  Post-ictal confusion  Other fatigue  Bilateral impacted cerumen     Discharge Instructions     As we discussed, both ears do not show an active infection.  She does have a lot of cerumen that is obscuring the eardrum.  I do see an ear tube in one of the ears but it is embedded in the cerumen. I did renew your Ciprodex just in case it is an ear issue. I do want you to contact Dr. Rosanna Randy and give him an update. I do also want you to contact Dr. Manuella Ghazi and give him an update as this may be seizure that is not  due to the ears and he may need to see her and alter her medications. Educational handouts provided. If symptoms worsen then please go to the ER.    ED Prescriptions    Medication Sig Dispense Auth. Provider   ciprofloxacin-dexamethasone (CIPRODEX) OTIC suspension Place 4 drops into both ears 3 (three) times daily as needed. 7.5 mL Verda Cumins, MD     PDMP not reviewed this encounter.   Verda Cumins, MD 02/14/21 2127

## 2021-02-13 NOTE — ED Triage Notes (Signed)
Patient presents to [facility] with mother. Patient mother states that yesterday patient had a seizure and she is concerned it could be due to an ear infection and would like her checked out.

## 2021-02-13 NOTE — Discharge Instructions (Addendum)
As we discussed, both ears do not show an active infection.  She does have a lot of cerumen that is obscuring the eardrum.  I do see an ear tube in one of the ears but it is embedded in the cerumen. I did renew your Ciprodex just in case it is an ear issue. I do want you to contact Dr. Sullivan Lone and give him an update. I do also want you to contact Dr. Sherryll Burger and give him an update as this may be seizure that is not due to the ears and he may need to see her and alter her medications. Educational handouts provided. If symptoms worsen then please go to the ER.

## 2021-02-25 ENCOUNTER — Other Ambulatory Visit: Payer: Self-pay | Admitting: Otolaryngology

## 2021-02-25 ENCOUNTER — Other Ambulatory Visit (HOSPITAL_COMMUNITY): Payer: Self-pay | Admitting: Otolaryngology

## 2021-02-25 DIAGNOSIS — H7192 Unspecified cholesteatoma, left ear: Secondary | ICD-10-CM

## 2021-02-26 ENCOUNTER — Telehealth: Payer: Self-pay

## 2021-02-26 NOTE — Telephone Encounter (Signed)
Received FMLA forms from [employer] to be completed. Forms placed in Dr. Elisabeth Cara box. Blank copy is on file with Medical Records. Please advise. Thanks TNP

## 2021-03-03 NOTE — Telephone Encounter (Signed)
FYI. Thanks.

## 2021-03-06 ENCOUNTER — Other Ambulatory Visit: Payer: Self-pay

## 2021-03-06 ENCOUNTER — Ambulatory Visit
Admission: RE | Admit: 2021-03-06 | Discharge: 2021-03-06 | Disposition: A | Discharge status: Home / Self Care | Payer: 59 | Source: Ambulatory Visit | Attending: Otolaryngology | Admitting: Otolaryngology

## 2021-03-06 DIAGNOSIS — H7192 Unspecified cholesteatoma, left ear: Secondary | ICD-10-CM

## 2021-04-17 ENCOUNTER — Other Ambulatory Visit: Payer: Self-pay | Admitting: *Deleted

## 2021-04-17 MED ORDER — CIPROFLOXACIN-DEXAMETHASONE 0.3-0.1 % OT SUSP: 4.0000 [drp] | Freq: Three times a day (TID) | OTIC | 0 refills | Status: DC | PRN

## 2021-06-09 ENCOUNTER — Encounter: Payer: Self-pay | Admitting: Family Medicine

## 2021-06-09 ENCOUNTER — Other Ambulatory Visit: Payer: Self-pay

## 2021-06-09 ENCOUNTER — Ambulatory Visit (INDEPENDENT_AMBULATORY_CARE_PROVIDER_SITE_OTHER): Payer: 59 | Admitting: Family Medicine

## 2021-06-09 VITALS — BP 109/65 | HR 91 | Temp 98.5°F | Wt 165.0 lb

## 2021-06-09 DIAGNOSIS — G40909 Epilepsy, unspecified, not intractable, without status epilepticus: Secondary | ICD-10-CM | POA: Diagnosis not present

## 2021-06-09 DIAGNOSIS — H6692 Otitis media, unspecified, left ear: Secondary | ICD-10-CM

## 2021-06-09 DIAGNOSIS — H6693 Otitis media, unspecified, bilateral: Secondary | ICD-10-CM

## 2021-06-09 MED ORDER — AMOXICILLIN-POT CLAVULANATE 875-125 MG PO TABS
1.0000 | ORAL_TABLET | Freq: Two times a day (BID) | ORAL | 1 refills | Status: DC
Start: 2021-06-09 — End: 2021-08-18

## 2021-06-09 NOTE — Progress Notes (Signed)
Established patient visit   Patient: Grace Garcia   DOB: 04/02/1996   25 y.o. Female  MRN: [id number] Visit Date: 06/09/2021  Today's healthcare provider: Megan Mans, MD   Chief Complaint  Patient presents with   Ear Pain    Subjective    Otalgia    Patient had a seizure on Saturday. Patient just had tubes placed in her ears about 2 weeks ago and mother wants to make sure everything is in place after she had her seizure.  She had a seizure yesterday morning that lasted about 3 minutes and she was postictal afterwards.  It was witnessed by her mother who is with her.  Since then she is not been herself.  She has had a little slurred speech and her chronic left facial droop appears to be a little bit more pronounced.  Mother and sister state that she seems to be a little slow presently.  Also she recently had another ear surgery on the left and is having some drainage and discomfort on the left ear.  No fever or chills.  No cough or any other symptoms that could be consistent with COVID.   Medications: Outpatient Medications Prior to Visit  Medication Sig   ciprofloxacin-dexamethasone (CIPRODEX) OTIC suspension Place 4 drops into both ears 3 (three) times daily as needed.   cloBAZam (ONFI) 10 MG tablet Take 5 mg by mouth 2 (two) times daily.   DIASTAT ACUDIAL 20 MG GEL    gabapentin (NEURONTIN) 300 MG capsule Take 300 mg by mouth 2 (two) times daily.   lamoTRIgine (LAMICTAL) 150 MG tablet Take 300 mg by mouth 2 (two) times daily.   mometasone (NASONEX) 50 MCG/ACT nasal spray PLACE 2 SPRAYS INTO THE NOSE DAILY.   norgestimate-ethinyl estradiol (MONO-LINYAH) 0.25-35 MG-MCG tablet TAKE 1 TABLET BY MOUTH DAILY. SKIP PLACEBO PILLS   gabapentin (NEURONTIN) 300 MG capsule TAKE 2 CAPSULES (600 MG) BY MOUTH 2 TIMES DAILY   No facility-administered medications prior to visit.    Review of Systems  HENT:  Positive for ear pain.       Objective    BP 109/65   Pulse  91   Temp 98.5 F (36.9 C)   Wt 165 lb (74.8 kg)   BMI 29.23 kg/m  BP Readings from Last 3 Encounters:  06/09/21 109/65  02/13/21 109/68  12/19/20 108/70   Wt Readings from Last 3 Encounters:  06/09/21 165 lb (74.8 kg)  02/13/21 164 lb 6 oz (74.6 kg)  12/19/20 164 lb 8 oz (74.6 kg)      Physical Exam Vitals reviewed.  Constitutional:      General: She is not in acute distress.    Appearance: Normal appearance. She is well-developed. She is not ill-appearing or diaphoretic.  HENT:     Head: Normocephalic and atraumatic.     Right Ear: Hearing, ear canal and external ear normal. Drainage and swelling present. No tenderness. A middle ear effusion is present. There is no impacted cerumen. No mastoid tenderness. A PE tube is present. Tympanic membrane is scarred and bulging. Tympanic membrane is not perforated or erythematous.     Left Ear: Hearing and external ear normal. No drainage, swelling or tenderness.  No middle ear effusion. There is no impacted cerumen. No mastoid tenderness. A PE tube is present. Tympanic membrane is scarred. Tympanic membrane is not perforated, erythematous or bulging.     Ears:     Comments: Mild amount of drainage  from the left EAC.  Drainage is not malodorous. I am unable to visualize the left TM due to the drainage.    Nose: Nose normal.     Mouth/Throat:     Mouth: Mucous membranes are moist.     Pharynx: Oropharynx is clear. Uvula midline. No oropharyngeal exudate.     Comments: Cleft palate Eyes:     General: No scleral icterus.       Right eye: No discharge.        Left eye: No discharge.     Extraocular Movements: Extraocular movements intact.     Conjunctiva/sclera: Conjunctivae normal.     Pupils: Pupils are equal, round, and reactive to light.  Neck:     Thyroid: No thyromegaly.     Vascular: No JVD.     Trachea: No tracheal deviation.  Cardiovascular:     Rate and Rhythm: Normal rate and regular rhythm.     Pulses: Normal pulses.      Heart sounds: Normal heart sounds. No murmur heard.   No friction rub. No gallop.  Pulmonary:     Effort: Pulmonary effort is normal. No respiratory distress.     Breath sounds: Normal breath sounds. No stridor. No wheezing or rales.  Chest:     Chest wall: No tenderness.  Abdominal:     General: Abdomen is flat. Bowel sounds are normal. There is no distension.     Palpations: Abdomen is soft. There is no mass.     Tenderness: There is no abdominal tenderness. There is no guarding or rebound.  Musculoskeletal:        General: No tenderness. Normal range of motion.     Cervical back: Normal range of motion and neck supple. No tenderness.  Lymphadenopathy:     Cervical: No cervical adenopathy.  Skin:    General: Skin is warm and dry.     Findings: No rash.  Neurological:     Mental Status: She is alert and oriented to person, place, and time. Mental status is at baseline.     Comments: Speech appears to be slightly slow and may be a little more slurred than normal.  Left facial droop that is chronic might be more pronounced today.  Psychiatric:        Mood and Affect: Mood normal.        Behavior: Behavior normal.        Thought Content: Thought content normal.        Judgment: Judgment normal.      No results found for any visits on 06/09/21.  Assessment & Plan     1. Bilateral otitis media, unspecified otitis media type Treat presumed otitis media with Augmentin.  He has ENT follow-up. - amoxicillin-clavulanate (AUGMENTIN) 875-125 MG tablet; Take 1 tablet by mouth 2 (two) times daily.  Dispense: 10 tablet; Refill: 1  2. OM (otitis media), recurrent, left Augmentin as above.  3. Seizure disorder [facility] Will reach out to Dr. Sherryll Burger from neurology as it trigger could have been mild air infection going on. He states he will see her in the next week.   No follow-ups on file.      I, Megan Mans, MD, have reviewed all documentation for this visit. The documentation on  06/10/21 for the exam, diagnosis, procedures, and orders are all accurate and complete.    Sylver Vantassell Wendelyn Breslow, MD  [facility] [phone] (phone) [phone] (fax)  [facility]

## 2021-06-12 ENCOUNTER — Other Ambulatory Visit: Payer: Self-pay

## 2021-06-12 MED ORDER — PREDNISONE 10 MG PO TABS
ORAL_TABLET | ORAL | 0 refills | Status: DC
  Filled 2021-06-12: qty 21, 6d supply, fill #0

## 2021-06-13 ENCOUNTER — Other Ambulatory Visit: Payer: Self-pay | Admitting: Neurology

## 2021-06-13 DIAGNOSIS — G510 Bell's palsy: Secondary | ICD-10-CM

## 2021-06-20 ENCOUNTER — Other Ambulatory Visit: Payer: Self-pay

## 2021-06-24 ENCOUNTER — Ambulatory Visit
Admission: RE | Admit: 2021-06-24 | Discharge: 2021-06-24 | Disposition: A | Discharge status: Home / Self Care | Payer: 59 | Source: Ambulatory Visit | Attending: Neurology | Admitting: Neurology

## 2021-06-24 ENCOUNTER — Other Ambulatory Visit: Payer: Self-pay

## 2021-06-24 DIAGNOSIS — G510 Bell's palsy: Secondary | ICD-10-CM | POA: Insufficient documentation

## 2021-08-18 ENCOUNTER — Encounter: Payer: Self-pay | Admitting: Family Medicine

## 2021-08-18 ENCOUNTER — Ambulatory Visit (INDEPENDENT_AMBULATORY_CARE_PROVIDER_SITE_OTHER): Payer: 59 | Admitting: Family Medicine

## 2021-08-18 ENCOUNTER — Other Ambulatory Visit: Payer: Self-pay

## 2021-08-18 VITALS — BP 115/81 | HR 85 | Temp 97.4°F | Resp 16 | Wt 166.1 lb

## 2021-08-18 DIAGNOSIS — G40909 Epilepsy, unspecified, not intractable, without status epilepticus: Secondary | ICD-10-CM | POA: Diagnosis not present

## 2021-08-18 DIAGNOSIS — F840 Autistic disorder: Secondary | ICD-10-CM

## 2021-08-18 DIAGNOSIS — H6692 Otitis media, unspecified, left ear: Secondary | ICD-10-CM | POA: Diagnosis not present

## 2021-08-18 MED ORDER — AMOXICILLIN-POT CLAVULANATE 875-125 MG PO TABS: 1.0000 | ORAL_TABLET | Freq: Two times a day (BID) | ORAL | 1 refills | Status: DC

## 2021-08-18 NOTE — Progress Notes (Signed)
Established patient visit   Patient: Grace Garcia   DOB: July 07, 1996   25 y.o. Female  MRN: [id number] Visit Date: 08/18/2021  Today's healthcare provider: Megan Mans, MD   Chief Complaint  Patient presents with   Ear Pain    Subjective    HPI  Patient is a 25 years old with c/o  Left ear pain.Mother reports that she noticed ear drainage on Friday. Patient complain that it hurts. Mother has been Cortisporin 1% OTIC solution. Patient has a long history of recurrent ear infections and had a recent surgery by ENT. Her mother noticed swelling around that left ear over the last couple days and the patient actually complained of pain which she never does.  No fever or systemic symptoms.  No seizures.  She has had the beginnings of purulent drainage from the left EAC   Medications: Outpatient Medications Prior to Visit  Medication Sig   cloBAZam (ONFI) 10 MG tablet Take 5 mg by mouth 2 (two) times daily.   DIASTAT ACUDIAL 20 MG GEL    gabapentin (NEURONTIN) 300 MG capsule Take 300 mg by mouth 2 (two) times daily.   NEOMYCIN-POLYMYXIN-HYDROCORTISONE (CORTISPORIN) 1 % SOLN OTIC solution ADMINISTER 3 DROPS INTO THE LEFT EAR TWO (2) TIMES A DAY.   norgestimate-ethinyl estradiol (MONO-LINYAH) 0.25-35 MG-MCG tablet TAKE 1 TABLET BY MOUTH DAILY. SKIP PLACEBO PILLS   ciprofloxacin-dexamethasone (CIPRODEX) OTIC suspension Place 4 drops into both ears 3 (three) times daily as needed. (Patient not taking: Reported on 08/18/2021)   gabapentin (NEURONTIN) 300 MG capsule TAKE 2 CAPSULES (600 MG) BY MOUTH 2 TIMES DAILY   mometasone (NASONEX) 50 MCG/ACT nasal spray PLACE 2 SPRAYS INTO THE NOSE DAILY.   [DISCONTINUED] amoxicillin-clavulanate (AUGMENTIN) 875-125 MG tablet Take 1 tablet by mouth 2 (two) times daily.   [DISCONTINUED] predniSONE (DELTASONE) 10 MG tablet Take 60mg  day 1, 50mg  day 2, 40mg  day 3, 30mg  day 4, 20mg  day 5, 10mg  day 6, then stop   No facility-administered  medications prior to visit.    Review of Systems     Objective    BP 115/81 (BP Location: Left Arm, Patient Position: Sitting, Cuff Size: Normal)   Pulse 85   Temp (!) 97.4 F (36.3 C) (Oral)   Resp 16   Wt 166 lb 1.6 oz (75.3 kg)   SpO2 100%   BMI 29.42 kg/m  {Show previous vital signs (optional):23777}  Physical Exam Vitals reviewed.  Constitutional:      General: She is not in acute distress.    Appearance: Normal appearance. She is well-developed. She is not ill-appearing or diaphoretic.  HENT:     Head: Normocephalic and atraumatic.     Right Ear: Hearing, ear canal and external ear normal. Drainage and swelling present. No tenderness. A middle ear effusion is present. There is no impacted cerumen. No mastoid tenderness. A PE tube is present. Tympanic membrane is scarred and bulging. Tympanic membrane is not perforated or erythematous.     Left Ear: Hearing and external ear normal. No drainage, swelling or tenderness.  No middle ear effusion. There is no impacted cerumen. No mastoid tenderness. A PE tube is present. Tympanic membrane is scarred. Tympanic membrane is not perforated, erythematous or bulging.     Ears:     Comments: Moderate  amount of purulent drainage from the left EAC.  Drainage is not malodorous. I am unable to visualize the left TM due to the drainage.    Nose:  Nose normal.     Mouth/Throat:     Pharynx: Uvula midline.     Comments: Cleft palate Eyes:     General: No scleral icterus.       Right eye: No discharge.        Left eye: No discharge.     Extraocular Movements: Extraocular movements intact.     Conjunctiva/sclera: Conjunctivae normal.     Pupils: Pupils are equal, round, and reactive to light.  Neck:     Thyroid: No thyromegaly.     Vascular: No JVD.     Trachea: No tracheal deviation.  Cardiovascular:     Rate and Rhythm: Normal rate and regular rhythm.     Pulses: Normal pulses.     Heart sounds: Normal heart sounds. No murmur  heard.   No friction rub. No gallop.  Pulmonary:     Effort: Pulmonary effort is normal. No respiratory distress.     Breath sounds: Normal breath sounds. No stridor. No wheezing or rales.  Chest:     Chest wall: No tenderness.  Abdominal:     General: There is no distension.     Palpations: Abdomen is soft. There is no mass.     Tenderness: There is no abdominal tenderness. There is no guarding or rebound.  Musculoskeletal:        General: No tenderness. Normal range of motion.     Cervical back: Neck supple. No tenderness.  Lymphadenopathy:     Cervical: No cervical adenopathy.  Skin:    General: Skin is warm and dry.     Findings: No rash.  Neurological:     Mental Status: She is alert and oriented to person, place, and time. Mental status is at baseline.     Comments: Speech appears to be slightly slow and may be a little more slurred than normal.  Left facial droop that is chronic might be more pronounced today.  Psychiatric:        Mood and Affect: Mood normal.        Behavior: Behavior normal.        Thought Content: Thought content normal.        Judgment: Judgment normal.      No results found for any visits on 08/18/21.  Assessment & Plan     1. OM (otitis media), recurrent, left At this time we will go ahead and treat with Augmentin but referred directly back to ENT with recent surgical intervention.  2. Active autistic disorder   3. Seizure disorder (H[facility]) No recent seizures.  No fever with this infection.  Hopefully acute intervention will lower this with   No follow-ups on file.      I, Megan Mans, MD, have reviewed all documentation for this visit. The documentation on 08/19/21 for the exam, diagnosis, procedures, and orders are all accurate and complete.    Quillan Whitter Wendelyn Breslow, MD  [facility] [phone] (phone) [phone] (fax)  [facility]

## 2021-11-27 ENCOUNTER — Other Ambulatory Visit: Payer: Self-pay | Admitting: Physician Assistant

## 2021-12-02 ENCOUNTER — Ambulatory Visit (INDEPENDENT_AMBULATORY_CARE_PROVIDER_SITE_OTHER): Payer: 59 | Admitting: Family Medicine

## 2021-12-02 ENCOUNTER — Encounter: Payer: Self-pay | Admitting: Family Medicine

## 2021-12-02 ENCOUNTER — Other Ambulatory Visit: Payer: Self-pay

## 2021-12-02 VITALS — BP 105/61 | HR 78 | Temp 98.1°F | Wt 164.0 lb

## 2021-12-02 DIAGNOSIS — H65195 Other acute nonsuppurative otitis media, recurrent, left ear: Secondary | ICD-10-CM

## 2021-12-02 DIAGNOSIS — H6690 Otitis media, unspecified, unspecified ear: Secondary | ICD-10-CM | POA: Insufficient documentation

## 2021-12-02 DIAGNOSIS — E663 Overweight: Secondary | ICD-10-CM

## 2021-12-02 DIAGNOSIS — G40909 Epilepsy, unspecified, not intractable, without status epilepticus: Secondary | ICD-10-CM | POA: Diagnosis not present

## 2021-12-02 DIAGNOSIS — F840 Autistic disorder: Secondary | ICD-10-CM | POA: Diagnosis not present

## 2021-12-02 MED ORDER — AMOXICILLIN-POT CLAVULANATE 875-125 MG PO TABS
1.0000 | ORAL_TABLET | Freq: Two times a day (BID) | ORAL | 0 refills | Status: DC
Start: 2021-12-02 — End: 2022-02-11

## 2021-12-02 MED ORDER — CIPROFLOXACIN-DEXAMETHASONE 0.3-0.1 % OT SUSP: 4.0000 [drp] | Freq: Three times a day (TID) | OTIC | 0 refills | Status: DC | PRN

## 2021-12-02 NOTE — Assessment & Plan Note (Signed)
AOM, recurrent ?No pus ?No impacted cerumen ?Will start augmentin, previously on 07/2021; mom did not wish to advance to levaquin ?Has ear gtts- request refill to assist with inflammation ?Hx of cholesteatoma s/p removal at [facility] ?

## 2021-12-02 NOTE — Assessment & Plan Note (Signed)
Body mass index is 29.05 kg/m?. ?Discussed importance of healthy weight management ?Discussed diet and exercise ?Pt is active in adult daycare ?

## 2021-12-02 NOTE — Assessment & Plan Note (Signed)
participates in adult daycare program ?Limited conversation in examination  ?

## 2021-12-02 NOTE — Assessment & Plan Note (Signed)
12/01/21 3 min seizure, with 3 min post ictal period ?No loss of bladder ?Irritation to forehead from area rug ?Slight bleeding noted, now resolved, from biting lip ?Acute on chronic concern ?Has f/u with neuro next month- goes q6 months ?On onfi BID ?On gaba BID ?On Lamictal ?Has Ativan PRN ?Has Nayzilam PRN ?Unclear cause- potential for AOM d/t recovery from cholesteatoma s/p repair at [facility] ?

## 2021-12-02 NOTE — Progress Notes (Signed)
? ?I,Elena D DeSanto,acting as a scribe for Jacky Kindle, FNP.,have documented all relevant documentation on the behalf of Jacky Kindle, FNP,as directed by  Jacky Kindle, FNP while in the presence of Jacky Kindle, FNP. ?  ? ? ?Established patient visit ? ? ?Patient: Grace Garcia   DOB: June 30, 1996   26 y.o. Female  MRN: [id number] ?Visit Date: 12/02/2021 ? ?Today's healthcare provider: Jacky Kindle, FNP  ? ?Chief Complaint  ?Patient presents with  ? Seizures  ? ?Subjective  ?  ?HPI  ?Patient is a 26 year old female who presents after having seizure last night. Patients mom states this is the second seizure in the past month.  ? ?Medications: ?Outpatient Medications Prior to Visit  ?Medication Sig  ? cloBAZam (ONFI) 10 MG tablet Take 10 mg by mouth 2 (two) times daily.  ? cyanocobalamin 1000 MCG tablet Take by mouth.  ? DIASTAT ACUDIAL 20 MG GEL   ? gabapentin (NEURONTIN) 300 MG capsule Take 1 capsule by mouth 2 (two) times daily.  ? lamoTRIgine (LAMICTAL) 150 MG tablet   ? LORazepam (ATIVAN) 1 MG tablet TAKE 1 TABLET BY MOUTH NIGHTLY AS NEEDED FOR ANXIETY  ? Midazolam (NAYZILAM) 5 MG/0.1ML SOLN PLEASE SEE ATTACHED FOR DETAILED DIRECTIONS  ? NEOMYCIN-POLYMYXIN-HYDROCORTISONE (CORTISPORIN) 1 % SOLN OTIC solution ADMINISTER 3 DROPS INTO THE LEFT EAR TWO (2) TIMES A DAY.  ? [DISCONTINUED] ciprofloxacin-dexamethasone (CIPRODEX) OTIC suspension Place 4 drops into both ears 3 (three) times daily as needed.  ? [DISCONTINUED] cloBAZam (ONFI) 10 MG tablet Take by mouth.  ? [DISCONTINUED] norgestimate-ethinyl estradiol (MONO-LINYAH) 0.25-35 MG-MCG tablet TAKE 1 TABLET BY MOUTH DAILY. SKIP PLACEBO PILLS  ? mometasone (NASONEX) 50 MCG/ACT nasal spray PLACE 2 SPRAYS INTO THE NOSE DAILY.  ? [DISCONTINUED] amoxicillin-clavulanate (AUGMENTIN) 875-125 MG tablet Take 1 tablet by mouth 2 (two) times daily.  ? [DISCONTINUED] gabapentin (NEURONTIN) 300 MG capsule Take 300 mg by mouth 2 (two) times daily.  ? [DISCONTINUED]  gabapentin (NEURONTIN) 300 MG capsule TAKE 2 CAPSULES (600 MG) BY MOUTH 2 TIMES DAILY  ? ?No facility-administered medications prior to visit.  ? ? ?Review of Systems  ?Constitutional:  Negative for appetite change, chills, fatigue and fever.  ?Respiratory:  Negative for chest tightness and shortness of breath.   ?Cardiovascular:  Negative for chest pain and palpitations.  ?Gastrointestinal:  Negative for abdominal pain, nausea and vomiting.  ?Neurological:  Negative for dizziness and weakness.  ? ? ?  Objective  ?  ?BP 105/61 (BP Location: Left Arm, Patient Position: Sitting, Cuff Size: Normal)   Pulse 78   Temp 98.1 ?F (36.7 ?C)   Wt 164 lb (74.4 kg)   BMI 29.05 kg/m?  ? ? ?Physical Exam ?Vitals and nursing note reviewed.  ?Constitutional:   ?   General: She is not in acute distress. ?   Appearance: Normal appearance. She is overweight. She is not ill-appearing, toxic-appearing or diaphoretic.  ?HENT:  ?   Head: Normocephalic and atraumatic.  ?   Salivary Glands: Right salivary gland is not diffusely enlarged or tender. Left salivary gland is not diffusely enlarged or tender.  ?   Right Ear: Tympanic membrane and ear canal normal. A PE tube is present.  ?   Left Ear: Tympanic membrane is erythematous.  ?   Ears:  ? ?Cardiovascular:  ?   Rate and Rhythm: Normal rate and regular rhythm.  ?   Pulses: Normal pulses.  ?   Heart sounds: Normal heart  sounds. No murmur heard. ?  No friction rub. No gallop.  ?Pulmonary:  ?   Effort: Pulmonary effort is normal. No respiratory distress.  ?   Breath sounds: Normal breath sounds. No stridor. No wheezing, rhonchi or rales.  ?Chest:  ?   Chest wall: No tenderness.  ?Abdominal:  ?   General: Bowel sounds are normal.  ?   Palpations: Abdomen is soft.  ?Musculoskeletal:     ?   General: No swelling, tenderness, deformity or signs of injury. Normal range of motion.  ?   Right lower leg: No edema.  ?   Left lower leg: No edema.  ?Skin: ?   General: Skin is warm and dry.  ?    Capillary Refill: Capillary refill takes less than 2 seconds.  ?   Coloration: Skin is not jaundiced or pale.  ?   Findings: No bruising, erythema, lesion or rash.  ?Neurological:  ?   General: No focal deficit present.  ?   Mental Status: She is alert and oriented to person, place, and time. Mental status is at baseline.  ?   Cranial Nerves: No cranial nerve deficit.  ?   Sensory: No sensory deficit.  ?   Motor: No weakness.  ?   Coordination: Coordination normal.  ?Psychiatric:     ?   Mood and Affect: Mood normal.     ?   Behavior: Behavior normal.     ?   Thought Content: Thought content normal.     ?   Judgment: Judgment normal.  ?  ? ?No results found for any visits on 12/02/21. ? Assessment & Plan  ?  ? ?Problem List Items Addressed This Visit   ? ?  ? Nervous and Auditory  ? Otitis media - Primary  ?  AOM, recurrent ?No pus ?No impacted cerumen ?Will start augmentin, previously on 07/2021; mom did not wish to advance to levaquin ?Has ear gtts- request refill to assist with inflammation ?Hx of cholesteatoma s/p removal at [facility] ?  ?  ? Relevant Medications  ? ciprofloxacin-dexamethasone (CIPRODEX) OTIC suspension  ? amoxicillin-clavulanate (AUGMENTIN) 875-125 MG tablet  ? Seizure disorder (HCC)  ?  12/01/21 3 min seizure, with 3 min post ictal period ?No loss of bladder ?Irritation to forehead from area rug ?Slight bleeding noted, now resolved, from biting lip ?Acute on chronic concern ?Has f/u with neuro next month- goes q6 months ?On onfi BID ?On gaba BID ?On Lamictal ?Has Ativan PRN ?Has Nayzilam PRN ?Unclear cause- potential for AOM d/t recovery from cholesteatoma s/p repair at [facility] ?  ?  ? Relevant Medications  ? gabapentin (NEURONTIN) 300 MG capsule  ? lamoTRIgine (LAMICTAL) 150 MG tablet  ? Midazolam (NAYZILAM) 5 MG/0.1ML SOLN  ?  ? Other  ? Active autistic disorder  ?  participates in adult daycare program ?Limited conversation in examination  ?  ?  ? Overweight (BMI 25.0-29.9)  ?  Body mass index is 29.05  kg/m?. ?Discussed importance of healthy weight management ?Discussed diet and exercise ?Pt is active in adult daycare ?  ?  ? ? ? ?No follow-ups on file.  ?   ? ?I, Jacky Kindle, FNP, have reviewed all documentation for this visit. The documentation on 12/02/21 for the exam, diagnosis, procedures, and orders are all accurate and complete. ? ? ? ?Jacky Kindle, FNP  ? Family Practice ?[phone] (phone) ?[phone] (fax) ? ?[facility] ?

## 2022-01-19 IMAGING — CR DG FACIAL BONES COMPLETE 3+V
1 series · 6 of 6 positions shown · non-contrast
Comparison: None.

CLINICAL DATA: 23-year-old female with seizure and trauma to the
right side of the face.

EXAM:
FACIAL BONES COMPLETE 3+V

[Series 1: dg facial bones complete · 0.14mm/px · 6 of 6 slices shown]
[im 1/6]
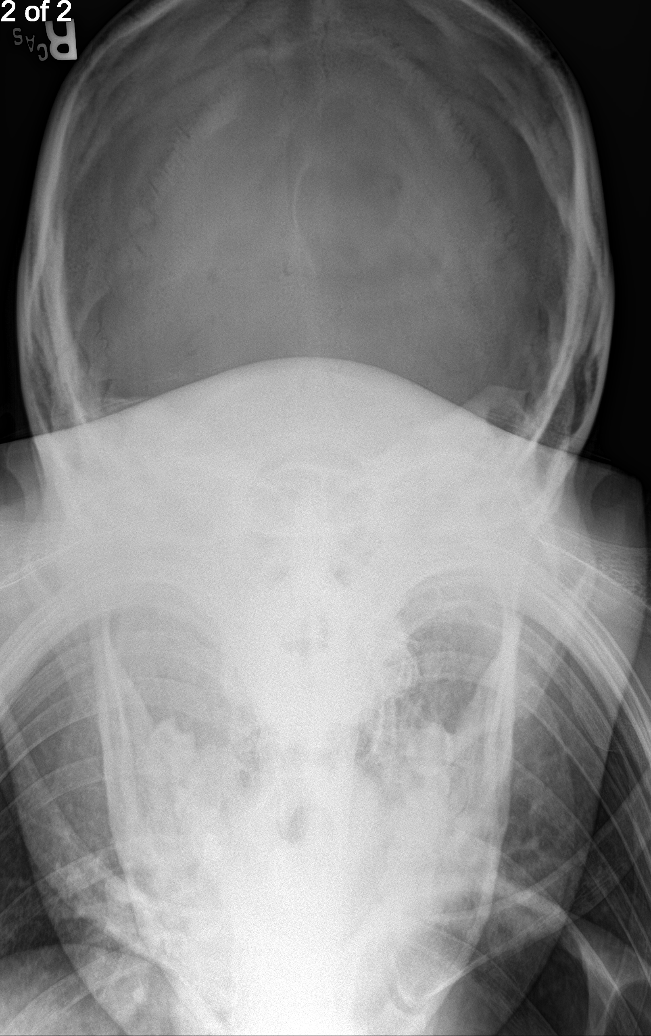
[im 2/6]
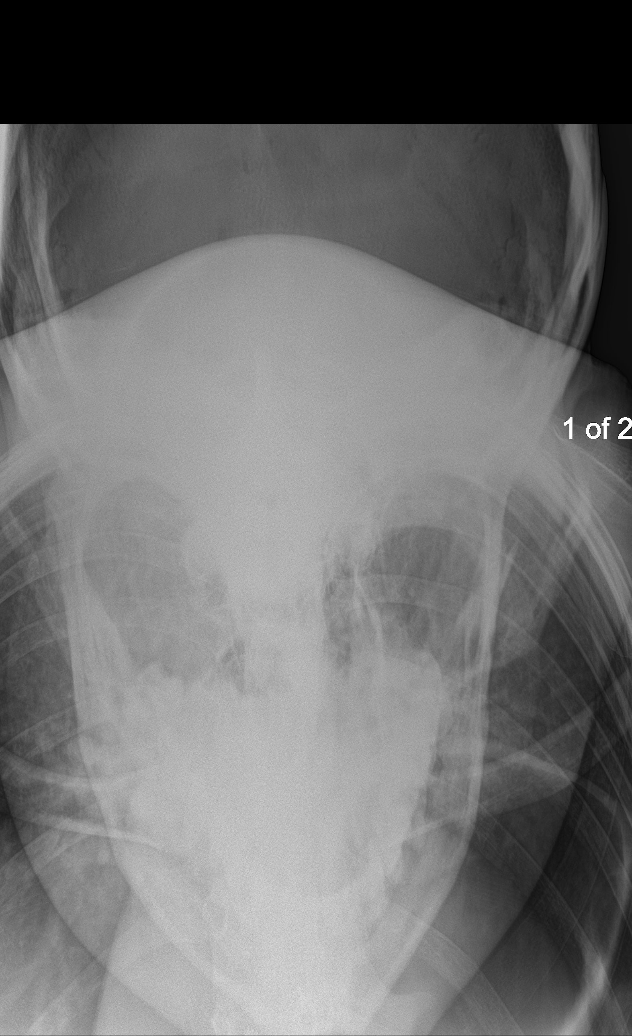
[im 3/6]
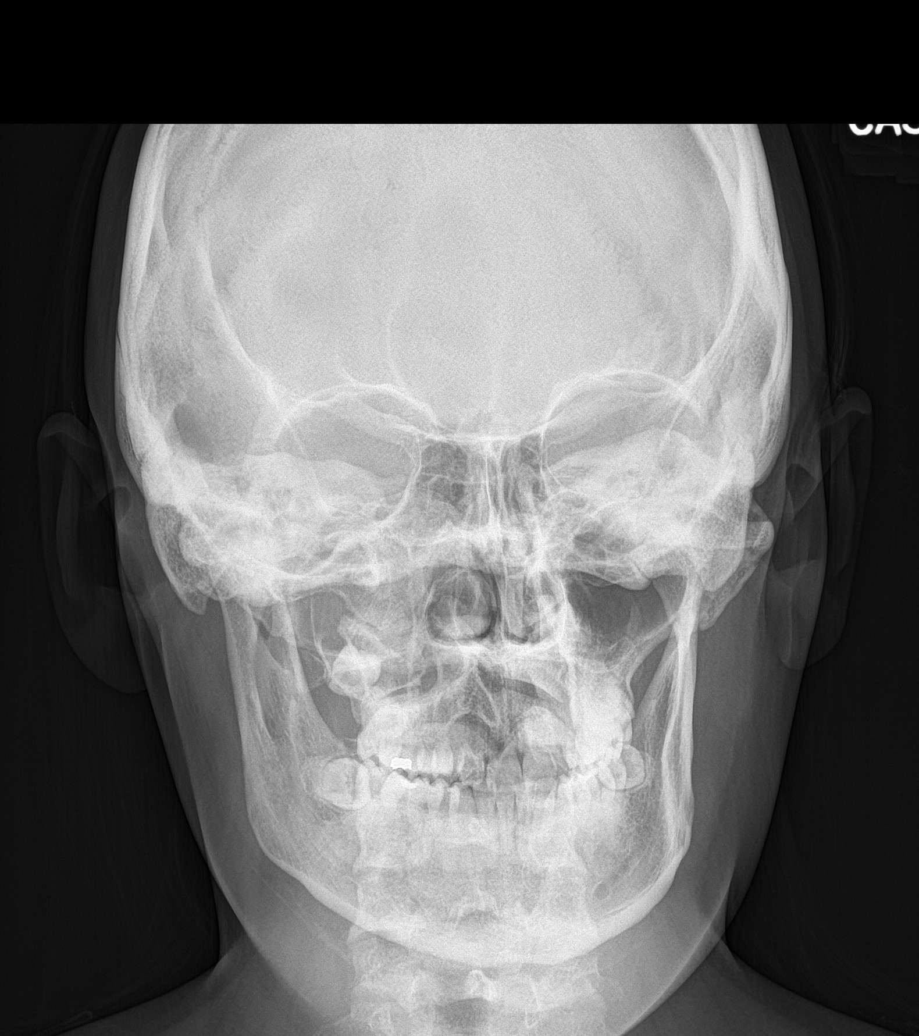
[im 4/6]
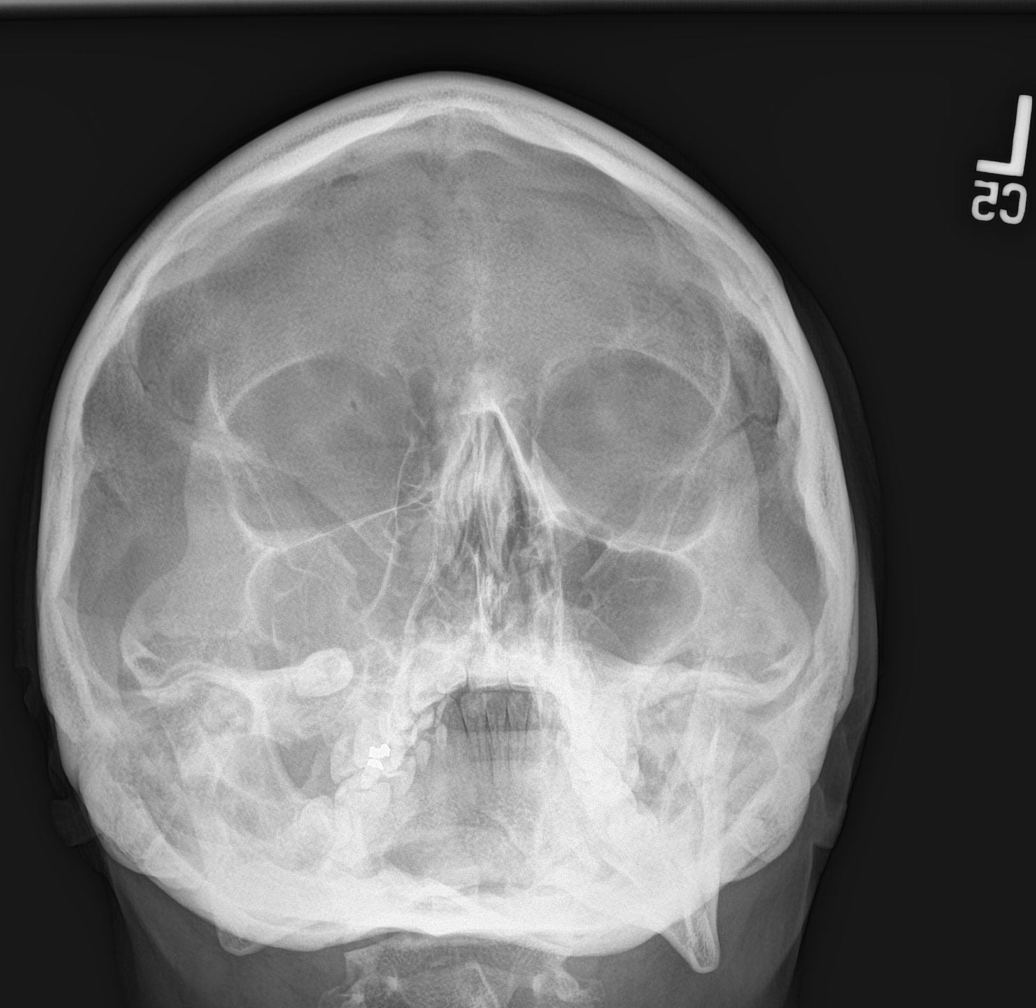
[im 5/6]
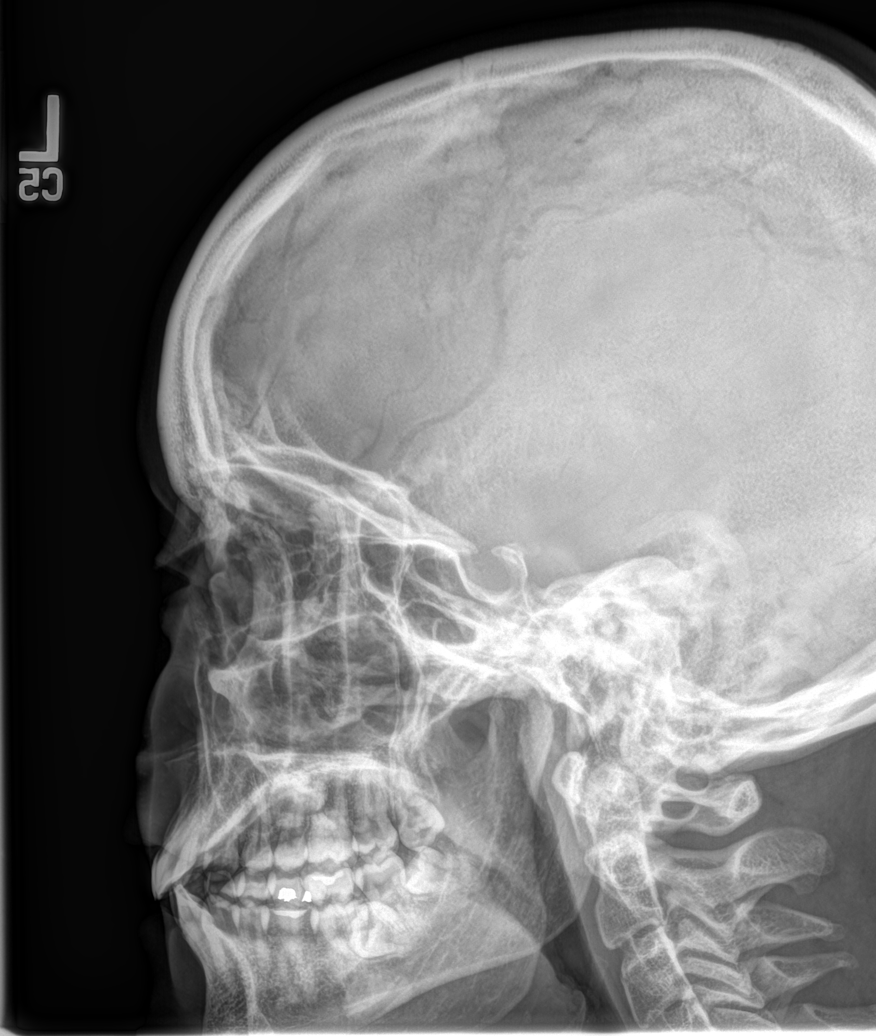
[im 6/6]
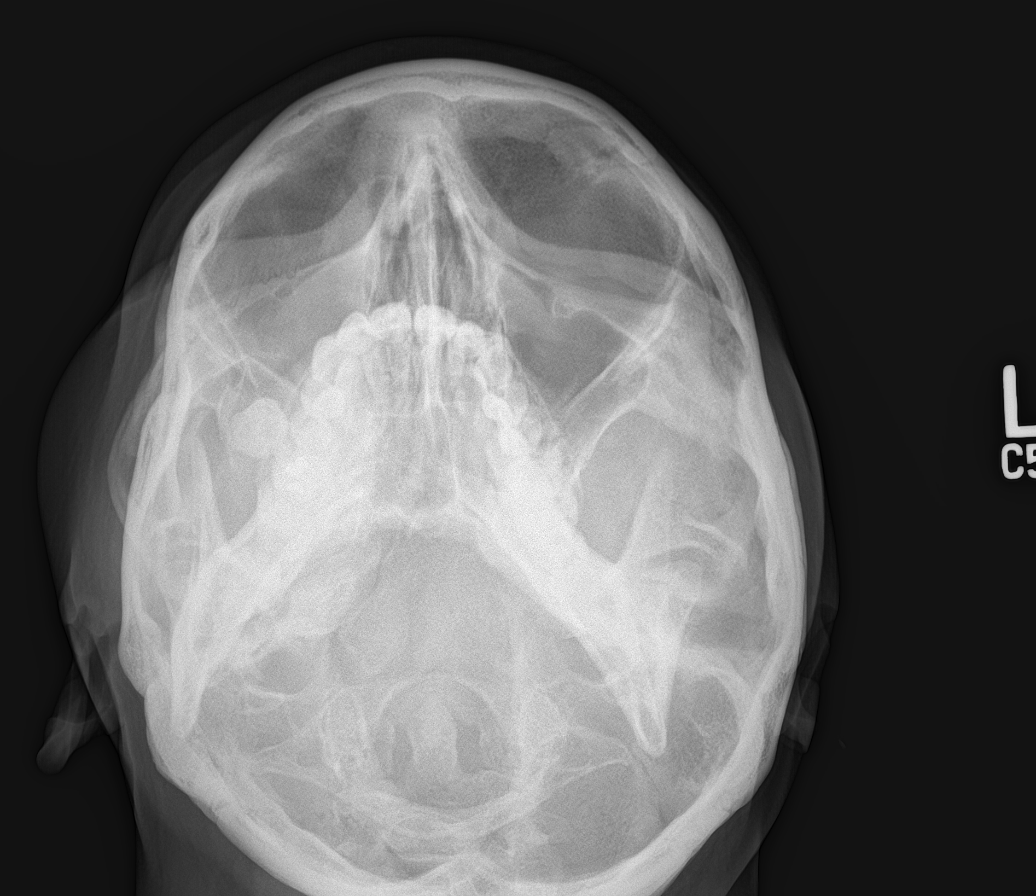

[6 of 6 positions shown; findings below may reference images not displayed]

FINDINGS: No definite acute fracture identified. Probable old fracture of one
of the orbital floors seen on the lateral view. No mandibular
subluxation noted. The maxillary sinuses are aerated. There is
partial opacification of the frontal sinuses. The mastoid air cells
are not aerated. Probable impaction of the right maxillary wisdom
tooth.
IMPRESSION: No definite acute fracture.

## 2022-02-11 ENCOUNTER — Encounter: Payer: Self-pay | Admitting: Family Medicine

## 2022-02-11 ENCOUNTER — Ambulatory Visit (INDEPENDENT_AMBULATORY_CARE_PROVIDER_SITE_OTHER): Payer: 59 | Admitting: Family Medicine

## 2022-02-11 VITALS — BP 113/75 | HR 88 | Temp 97.3°F | Ht 63.0 in | Wt 166.0 lb

## 2022-02-11 DIAGNOSIS — H7192 Unspecified cholesteatoma, left ear: Secondary | ICD-10-CM

## 2022-02-11 DIAGNOSIS — G40909 Epilepsy, unspecified, not intractable, without status epilepticus: Secondary | ICD-10-CM

## 2022-02-11 DIAGNOSIS — Z0000 Encounter for general adult medical examination without abnormal findings: Secondary | ICD-10-CM

## 2022-02-11 DIAGNOSIS — F840 Autistic disorder: Secondary | ICD-10-CM | POA: Diagnosis not present

## 2022-02-11 DIAGNOSIS — Z79899 Other long term (current) drug therapy: Secondary | ICD-10-CM | POA: Diagnosis not present

## 2022-02-11 DIAGNOSIS — E538 Deficiency of other specified B group vitamins: Secondary | ICD-10-CM

## 2022-02-11 NOTE — Progress Notes (Signed)
Complete physical exam  I,Grace Garcia,acting as a scribe for Grace Durie, MD.,have documented all relevant documentation on the behalf of Grace Durie, MD,as directed by  Grace Durie, MD while in the presence of Grace Durie, MD.   Patient: Grace Garcia   DOB: [birth year]   26 y.o. Female  MRN: [id number] Visit Date: 02/11/2022  Today's healthcare provider: Wilhemena Durie, MD   Chief Complaint  Patient presents with   Annual Exam   Subjective    Grace Garcia is a 26 y.o. female who presents today for a complete physical exam.  She reports consuming a general diet. Home exercise routine includes some. She generally feels well. She reports sleeping fairly well. She does not have additional problems to discuss today.  Patient lives with her parents and is dependent on them.  Her mother accompanies her today for the exam. HPI    Past Medical History:  Diagnosis Date   Autistic behavior    Seizures ([place])    Past Surgical History:  Procedure Laterality Date   ASD REPAIR  2002   CARDIAC CATHETERIZATION     to repair heart defect with mesh   MYRINGOTOMY WITH TUBE PLACEMENT     Social History   Socioeconomic History   Marital status: Single    Spouse name: Not on file   Number of children: Not on file   Years of education: Not on file   Highest education level: Not on file  Occupational History   Not on file  Tobacco Use   Smoking status: Never   Smokeless tobacco: Never  Vaping Use   Vaping Use: Never used  Substance and Sexual Activity   Alcohol use: No   Drug use: No   Sexual activity: Never  Other Topics Concern   Not on file  Social History Narrative   Not on file   Social Determinants of Health   Financial Resource Strain: Not on file  Food Insecurity: Not on file  Transportation Needs: Not on file  Physical Activity: Not on file  Stress: Not on file  Social Connections: Not on file  Intimate Partner  Violence: Not on file   Family Status  Relation Name Status   Mother  Alive   Father  Alive   Sister  Alive   Brother  Alive   PGF  Deceased   MGF  (Not Specified)   Family History  Problem Relation Age of Onset   ADD / ADHD Brother    Heart disease Paternal Grandfather    Diabetes Maternal Grandfather    Hypertension Maternal Grandfather    No Known Allergies  Patient Care Team: Jerrol Banana., MD as PCP - General (Family Medicine)   Medications: Outpatient Medications Prior to Visit  Medication Sig   ciprofloxacin-dexamethasone (CIPRODEX) OTIC suspension Place 4 drops into both ears 3 (three) times daily as needed.   cloBAZam (ONFI) 10 MG tablet Take 1 tablet by mouth 2 (two) times daily.   cyanocobalamin 1000 MCG tablet Take by mouth.   gabapentin (NEURONTIN) 300 MG capsule Take 300 mg by mouth 2 (two) times daily.   lamoTRIgine (LAMICTAL) 150 MG tablet    LORazepam (ATIVAN) 1 MG tablet TAKE 1 TABLET BY MOUTH NIGHTLY AS NEEDED FOR ANXIETY   Midazolam (NAYZILAM) 5 MG/0.1ML SOLN PLEASE SEE ATTACHED FOR DETAILED DIRECTIONS   NEOMYCIN-POLYMYXIN-HYDROCORTISONE (CORTISPORIN) 1 % SOLN OTIC solution ADMINISTER 3 DROPS INTO THE LEFT EAR TWO (2)  TIMES A DAY.   norgestimate-ethinyl estradiol (ORTHO-CYCLEN) 0.25-35 MG-MCG tablet TAKE 1 TABLET BY MOUTH DAILY. SKIP PLACEBO PILLS   mometasone (NASONEX) 50 MCG/ACT nasal spray PLACE 2 SPRAYS INTO THE NOSE DAILY.   [DISCONTINUED] amoxicillin-clavulanate (AUGMENTIN) 875-125 MG tablet Take 1 tablet by mouth 2 (two) times daily.   [DISCONTINUED] cloBAZam (ONFI) 10 MG tablet Take 10 mg by mouth 2 (two) times daily. (Patient not taking: Reported on 02/11/2022)   [DISCONTINUED] DIASTAT ACUDIAL 20 MG GEL  (Patient not taking: Reported on 02/11/2022)   No facility-administered medications prior to visit.    Review of Systems  Neurological:  Positive for seizures.  All other systems reviewed and are negative.  Last metabolic panel Lab  Results  Component Value Date   GLUCOSE 79 12/19/2020   NA 143 12/19/2020   K 4.3 12/19/2020   CL 106 12/19/2020   CO2 18 (L) 12/19/2020   BUN 8 12/19/2020   CREATININE 0.76 12/19/2020   EGFR 111 12/19/2020   CALCIUM 9.4 12/19/2020   PROT 7.4 12/19/2020   ALBUMIN 4.4 12/19/2020   LABGLOB 3.0 12/19/2020   AGRATIO 1.5 12/19/2020   BILITOT 0.3 12/19/2020   ALKPHOS 92 12/19/2020   AST 11 12/19/2020   ALT 10 12/19/2020   ANIONGAP 9 04/02/2014      Objective     BP 113/75 (BP Location: Right Arm, Patient Position: Sitting, Cuff Size: Normal)   Pulse 88   Temp (!) 97.3 F (36.3 C) (Oral)   Ht 5' 3"  (1.6 m)   Wt 166 lb (75.3 kg)   SpO2 100%   BMI 29.41 kg/m  Wt Readings from Last 3 Encounters:  02/11/22 166 lb (75.3 kg)  12/02/21 164 lb (74.4 kg)  08/18/21 166 lb 1.6 oz (75.3 kg)       Physical Exam Vitals reviewed. Exam conducted with a chaperone present.  Constitutional:      General: She is not in acute distress.    Appearance: Normal appearance. She is well-developed. She is not ill-appearing or diaphoretic.  HENT:     Head: Normocephalic and atraumatic.     Right Ear: Hearing, ear canal and external ear normal. A PE tube is present. Tympanic membrane is scarred.     Left Ear: Hearing, ear canal and external ear normal. A PE tube is present. Tympanic membrane is scarred.     Nose: Nose normal.     Mouth/Throat:     Mouth: Mucous membranes are moist.     Pharynx: Oropharynx is clear. No oropharyngeal exudate.     Comments: Cleft palate Eyes:     General: No scleral icterus.       Right eye: No discharge.        Left eye: No discharge.     Extraocular Movements: Extraocular movements intact.     Conjunctiva/sclera: Conjunctivae normal.     Pupils: Pupils are equal, round, and reactive to light.  Neck:     Thyroid: No thyromegaly.     Vascular: No JVD.     Trachea: No tracheal deviation.  Cardiovascular:     Rate and Rhythm: Normal rate and regular rhythm.      Pulses: Normal pulses.     Heart sounds: Normal heart sounds. No murmur heard.   No friction rub. No gallop.  Pulmonary:     Effort: Pulmonary effort is normal. No respiratory distress.     Breath sounds: Normal breath sounds. No wheezing or rales.  Chest:  Chest wall: No tenderness.  Abdominal:     General: Bowel sounds are normal. There is no distension.     Palpations: Abdomen is soft. There is no mass.     Tenderness: There is no abdominal tenderness. There is no guarding or rebound.  Musculoskeletal:        General: No tenderness.     Cervical back: Neck supple. No tenderness.  Lymphadenopathy:     Cervical: No cervical adenopathy.  Skin:    General: Skin is warm and dry.     Findings: No rash.  Neurological:     Mental Status: She is alert and oriented to person, place, and time.  Psychiatric:        Mood and Affect: Mood normal.        Behavior: Behavior normal.        Thought Content: Thought content normal.        Judgment: Judgment normal.      Last depression screening scores    12/19/2020   10:28 AM 07/25/2020    9:04 AM 07/14/2019    8:37 AM  PHQ 2/9 Scores  PHQ - 2 Score 0 0 0  PHQ- 9 Score 0 0 0   Last fall risk screening    12/19/2020   10:27 AM  Fall Risk   Falls in the past year? 1  Number falls in past yr: 1  Injury with Fall? 1  Risk for fall due to : History of fall(s)  Follow up Falls prevention discussed   Last Audit-C alcohol use screening    12/19/2020   10:29 AM  Alcohol Use Disorder Test (AUDIT)  1. How often do you have a drink containing alcohol? 0  2. How many drinks containing alcohol do you have on a typical day when you are drinking? 0  3. How often do you have six or more drinks on one occasion? 0  AUDIT-C Score 0  Alcohol Brief Interventions/Follow-up AUDIT Score <7 follow-up not indicated   A score of 3 or more in women, and 4 or more in men indicates increased risk for alcohol abuse, EXCEPT if all of the points  are from question 1   No results found for any visits on 02/11/22.  Assessment & Plan    Routine Health Maintenance and Physical Exam  Exercise Activities and Dietary recommendations  Goals   None     Immunization History  Administered Date(s) Administered   DTaP 11/30/1994, 02/02/1995, 04/05/1995, 05/02/1996, 04/28/1999   HPV Quadrivalent 01/27/2011, 02/16/2012, 07/19/2012   Hepatitis A 06/07/2007, 07/10/2008   Hepatitis B 1994-05[birth year]09, 10/29/1994, 06/16/1995   HiB (PRP-OMP) 11/30/1994, 02/02/1995, 04/05/1995, 01/04/1996   IPV 11/30/1994, 02/02/1995, 04/05/1995, 04/28/1999   Influenza,inj,Quad PF,6+ Mos 06/20/2016, 06/05/2017, 07/16/2018, 06/01/2019, 08/21/2020   Influenza-Unspecified 07/04/2015, 07/10/2021   MMR 05/02/1996, 04/28/1999   Meningococcal Conjugate 01/27/2011   PFIZER(Purple Top)SARS-COV-2 Vaccination 11/19/2019, 12/12/2019, 09/13/2020   Tdap 06/07/2007, 10/16/2018   Varicella 04/28/1999, 06/07/2007    Health Maintenance  Topic Date Due   HIV Screening  Never done   Hepatitis C Screening  Never done   PAP-Cervical Cytology Screening  Never done   COVID-19 Vaccine (4 - Booster for Pfizer series) 11/08/2020   INFLUENZA VACCINE  04/28/2022   TETANUS/TDAP  10/16/2028   HPV VACCINES  Completed   PAP SMEAR-Modifier  Discontinued    Discussed health benefits of physical activity, and encouraged her to engage in regular exercise appropriate for her age and condition.  1. Annual physical exam  Patient is cared for by her parents.  Mother states there is no need for Pap smear.  Any intervention such as pelvic exam or Pap smear in the future would require anesthesia. - Lipid panel - TSH - CBC w/Diff/Platelet - Comprehensive Metabolic Panel (CMET)  2. Seizure disorder ([place]) 2 months since last seizure - Lipid panel - TSH - CBC w/Diff/Platelet - Comprehensive Metabolic Panel (CMET) - Lamotrigine level  3. Active autistic disorder  - Lipid panel - TSH - CBC  w/Diff/Platelet - Comprehensive Metabolic Panel (CMET)  4. High risk medication use  - Lipid panel - TSH - CBC w/Diff/Platelet - Comprehensive Metabolic Panel (CMET)  5. B12 deficiency  - Lipid panel - TSH - CBC w/Diff/Platelet - Comprehensive Metabolic Panel (CMET) - Vitamin B12  6. Cholesteatoma of left ear This post cholesteatoma removal from the left ear with a new TM built May 28, 2021.   No follow-ups on file.     I, Grace Durie, MD, have reviewed all documentation for this visit. The documentation on 02/15/22 for the exam, diagnosis, procedures, and orders are all accurate and complete.    Neythan Kozlov Cranford Mon, MD  [facility] [phone] (phone) [phone] (fax)  [place]

## 2022-02-18 LAB — CBC WITH DIFFERENTIAL/PLATELET
Basophils Absolute: 0 10*3/uL (ref 0.0–0.2)
Basos: 1 %
EOS (ABSOLUTE): 0.1 10*3/uL (ref 0.0–0.4)
Eos: 1 %
Hematocrit: 35.8 % (ref 34.0–46.6)
Hemoglobin: 12.4 g/dL (ref 11.1–15.9)
Immature Grans (Abs): 0 10*3/uL (ref 0.0–0.1)
Immature Granulocytes: 0 %
Lymphocytes Absolute: 2.3 10*3/uL (ref 0.7–3.1)
Lymphs: 36 %
MCH: 31.3 pg (ref 26.6–33.0)
MCHC: 34.6 g/dL (ref 31.5–35.7)
MCV: 90 fL (ref 79–97)
Monocytes Absolute: 0.3 10*3/uL (ref 0.1–0.9)
Monocytes: 5 %
Neutrophils Absolute: 3.8 10*3/uL (ref 1.4–7.0)
Neutrophils: 57 %
Platelets: 309 10*3/uL (ref 150–450)
RBC: 3.96 x10E6/uL (ref 3.77–5.28)
RDW: 12.7 % (ref 11.7–15.4)
WBC: 6.5 10*3/uL (ref 3.4–10.8)

## 2022-02-18 LAB — COMPREHENSIVE METABOLIC PANEL
ALT: 11 IU/L (ref 0–32)
AST: 10 IU/L (ref 0–40)
Albumin/Globulin Ratio: 2.2 (ref 1.2–2.2)
Albumin: 4.6 g/dL (ref 3.9–5.0)
Alkaline Phosphatase: 91 IU/L (ref 44–121)
BUN/Creatinine Ratio: 11 (ref 9–23)
BUN: 9 mg/dL (ref 6–20)
Bilirubin Total: 0.3 mg/dL (ref 0.0–1.2)
CO2: 21 mmol/L (ref 20–29)
Calcium: 9.2 mg/dL (ref 8.7–10.2)
Chloride: 102 mmol/L (ref 96–106)
Creatinine, Ser: 0.8 mg/dL (ref 0.57–1.00)
Globulin, Total: 2.1 g/dL (ref 1.5–4.5)
Glucose: 84 mg/dL (ref 70–99)
Potassium: 4.5 mmol/L (ref 3.5–5.2)
Sodium: 137 mmol/L (ref 134–144)
Total Protein: 6.7 g/dL (ref 6.0–8.5)
eGFR: 103 mL/min/{1.73_m2} (ref >59)

## 2022-02-18 LAB — TSH: TSH: 2.35 u[IU]/mL (ref 0.450–4.500)

## 2022-02-18 LAB — LIPID PANEL
Chol/HDL Ratio: 3.6 ratio (ref 0.0–4.4)
Cholesterol, Total: 213 mg/dL — ABNORMAL HIGH (ref 100–199)
HDL: 60 mg/dL (ref >39)
LDL Chol Calc (NIH): 126 mg/dL — ABNORMAL HIGH (ref 0–99)
Triglycerides: 156 mg/dL — ABNORMAL HIGH (ref 0–149)
VLDL Cholesterol Cal: 27 mg/dL (ref 5–40)

## 2022-02-18 LAB — VITAMIN B12: Vitamin B-12: 1966 pg/mL — ABNORMAL HIGH (ref 232–1245)

## 2022-02-18 LAB — LAMOTRIGINE LEVEL: Lamotrigine Lvl: 15.7 ug/mL (ref 2.0–20.0)

## 2022-03-17 ENCOUNTER — Encounter: Payer: Self-pay | Admitting: Family Medicine

## 2022-03-17 ENCOUNTER — Ambulatory Visit (INDEPENDENT_AMBULATORY_CARE_PROVIDER_SITE_OTHER): Payer: 59 | Admitting: Family Medicine

## 2022-03-17 VITALS — BP 92/59 | HR 79 | Temp 97.5°F | Resp 16 | Wt 161.0 lb

## 2022-03-17 DIAGNOSIS — H6692 Otitis media, unspecified, left ear: Secondary | ICD-10-CM

## 2022-03-17 MED ORDER — AMOXICILLIN-POT CLAVULANATE 875-125 MG PO TABS: 1.0000 | ORAL_TABLET | Freq: Two times a day (BID) | ORAL | 1 refills | Status: DC

## 2022-03-17 NOTE — Progress Notes (Unsigned)
     I,Laakea Pereira S Dvon Jiles,acting as a scribe for Megan Mans, MD.,have documented all relevant documentation on the behalf of Megan Mans, MD,as directed by  Megan Mans, MD while in the presence of Megan Mans, MD.   Established patient visit   Patient: Grace Garcia   DOB: January 03, 1996   26 y.o. Female  MRN: [id number] Visit Date: 03/17/2022  Today's healthcare provider: Megan Mans, MD   No chief complaint on file.  Subjective    Otalgia  There is pain in the left ear. This is a recurrent problem. The current episode started today. The problem occurs constantly. The problem has been unchanged. There has been no fever. The pain is mild. Pertinent negatives include no coughing, ear discharge or sore throat.     Medications: Outpatient Medications Prior to Visit  Medication Sig   ciprofloxacin-dexamethasone (CIPRODEX) OTIC suspension Place 4 drops into both ears 3 (three) times daily as needed.   cloBAZam (ONFI) 10 MG tablet Take 1 tablet by mouth 2 (two) times daily.   cyanocobalamin 1000 MCG tablet Take by mouth.   gabapentin (NEURONTIN) 300 MG capsule Take 300 mg by mouth 2 (two) times daily.   lamoTRIgine (LAMICTAL) 150 MG tablet    LORazepam (ATIVAN) 1 MG tablet TAKE 1 TABLET BY MOUTH NIGHTLY AS NEEDED FOR ANXIETY   Midazolam (NAYZILAM) 5 MG/0.1ML SOLN PLEASE SEE ATTACHED FOR DETAILED DIRECTIONS   mometasone (NASONEX) 50 MCG/ACT nasal spray PLACE 2 SPRAYS INTO THE NOSE DAILY.   NEOMYCIN-POLYMYXIN-HYDROCORTISONE (CORTISPORIN) 1 % SOLN OTIC solution ADMINISTER 3 DROPS INTO THE LEFT EAR TWO (2) TIMES A DAY.   norgestimate-ethinyl estradiol (ORTHO-CYCLEN) 0.25-35 MG-MCG tablet TAKE 1 TABLET BY MOUTH DAILY. SKIP PLACEBO PILLS   No facility-administered medications prior to visit.    Review of Systems  Constitutional:  Negative for chills and fever.  HENT:  Positive for ear pain. Negative for ear discharge and sore throat.   Respiratory:   Negative for cough.     {Labs  Heme  Chem  Endocrine  Serology  Results Review (optional):23779}   Objective    There were no vitals taken for this visit. {Show previous vital signs (optional):23777}  Physical Exam  ***  No results found for any visits on 03/17/22.  Assessment & Plan     ***  No follow-ups on file.      {provider attestation***:1}   Megan Mans, MD  [facility] [phone] (phone) [phone] (fax)  [facility]

## 2022-03-26 ENCOUNTER — Ambulatory Visit (INDEPENDENT_AMBULATORY_CARE_PROVIDER_SITE_OTHER): Payer: 59 | Admitting: Family Medicine

## 2022-03-26 VITALS — BP 111/74 | HR 90 | Temp 98.2°F | Wt 161.0 lb

## 2022-03-26 DIAGNOSIS — F840 Autistic disorder: Secondary | ICD-10-CM

## 2022-03-26 DIAGNOSIS — G40909 Epilepsy, unspecified, not intractable, without status epilepticus: Secondary | ICD-10-CM

## 2022-03-26 DIAGNOSIS — F422 Mixed obsessional thoughts and acts: Secondary | ICD-10-CM | POA: Diagnosis not present

## 2022-03-26 DIAGNOSIS — H65195 Other acute nonsuppurative otitis media, recurrent, left ear: Secondary | ICD-10-CM | POA: Diagnosis not present

## 2022-03-26 MED ORDER — SERTRALINE HCL 50 MG PO TABS: 50.0000 mg | ORAL_TABLET | Freq: Every day | ORAL | 11 refills | Status: DC

## 2022-03-26 NOTE — Progress Notes (Signed)
Established patient visit   Patient: Grace Garcia   DOB: 25-Dec-1995   26 y.o. Female  MRN: [id number] Visit Date: 03/26/2022  Today's healthcare provider: Megan Mans, MD   No chief complaint on file.  Subjective    HPI  Patient is a 26 year old female who presents today for follow up of her mental health.  Patient is becoming more aggressive and emotional.  Mother states she is not sure if it is her anxiety, autism or OCD.   Lamictal has been increased recently to address increased seizures.  She has had a recent ear infection that was treated and apparently is better.  The emotional changes have been going on for a month or 2 prior to this ear infection.  Her mother father and siblings have all noticed a difference. Medications: Outpatient Medications Prior to Visit  Medication Sig   cloBAZam (ONFI) 10 MG tablet Take 1 tablet by mouth 2 (two) times daily.   cyanocobalamin 1000 MCG tablet Take by mouth.   gabapentin (NEURONTIN) 300 MG capsule Take 300 mg by mouth 2 (two) times daily.   lamoTRIgine (LAMICTAL) 150 MG tablet    LORazepam (ATIVAN) 1 MG tablet TAKE 1 TABLET BY MOUTH NIGHTLY AS NEEDED FOR ANXIETY   Midazolam (NAYZILAM) 5 MG/0.1ML SOLN PLEASE SEE ATTACHED FOR DETAILED DIRECTIONS   NEOMYCIN-POLYMYXIN-HYDROCORTISONE (CORTISPORIN) 1 % SOLN OTIC solution ADMINISTER 3 DROPS INTO THE LEFT EAR TWO (2) TIMES A DAY.   norgestimate-ethinyl estradiol (ORTHO-CYCLEN) 0.25-35 MG-MCG tablet TAKE 1 TABLET BY MOUTH DAILY. SKIP PLACEBO PILLS   mometasone (NASONEX) 50 MCG/ACT nasal spray PLACE 2 SPRAYS INTO THE NOSE DAILY.   [DISCONTINUED] amoxicillin-clavulanate (AUGMENTIN) 875-125 MG tablet Take 1 tablet by mouth 2 (two) times daily.   [DISCONTINUED] ciprofloxacin-dexamethasone (CIPRODEX) OTIC suspension Place 4 drops into both ears 3 (three) times daily as needed. (Patient not taking: Reported on 03/17/2022)   No facility-administered medications prior to visit.     Review of Systems  Psychiatric/Behavioral:  Positive for agitation and behavioral problems. The patient is nervous/anxious.     Last thyroid functions Lab Results  Component Value Date   TSH 2.350 02/17/2022       Objective    BP 111/74 (BP Location: Right Arm, Patient Position: Sitting, Cuff Size: Normal)   Pulse 90   Temp 98.2 F (36.8 C) (Oral)   Wt 161 lb (73 kg)   SpO2 98%   BMI 28.52 kg/m  BP Readings from Last 3 Encounters:  03/26/22 111/74  03/17/22 (!) 92/59  02/11/22 113/75   Wt Readings from Last 3 Encounters:  03/26/22 161 lb (73 kg)  03/17/22 161 lb (73 kg)  02/11/22 166 lb (75.3 kg)      Physical Exam Vitals reviewed.  HENT:     Right Ear: Tympanic membrane normal.     Left Ear: Tympanic membrane normal.  Neurological:     Mental Status: She is alert.  Psychiatric:        Mood and Affect: Mood normal.        Behavior: Behavior normal.       No results found for any visits on 03/26/22.  Assessment & Plan     1. Seizure disorder [facility]) Followed by neurology, Dr. Sherryll Burger  2. Other recurrent acute nonsuppurative otitis media of left ear Resolved with treatment  3. Active autistic disorder   4. Mixed obsessional thoughts and acts After discussion with the mother and patient we will try sertraline  50 mg daily.  Follow-up in 2 months.   No follow-ups on file.      I, Megan Mans, MD, have reviewed all documentation for this visit. The documentation on 03/26/22 for the exam, diagnosis, procedures, and orders are all accurate and complete.    Vincy Feliz Wendelyn Breslow, MD  [facility] [phone] (phone) [phone] (fax)  [facility]

## 2022-04-17 ENCOUNTER — Other Ambulatory Visit: Payer: Self-pay | Admitting: Family Medicine

## 2022-04-17 NOTE — Telephone Encounter (Signed)
Request 90 day supply- Rx written 03/26/22 #30 11 RF Requested Prescriptions  Pending Prescriptions Disp Refills  . sertraline (ZOLOFT) 50 MG tablet [Pharmacy Med Name: SERTRALINE HCL 50 MG TABLET] 90 tablet 3    Sig: TAKE 1 TABLET BY MOUTH EVERY DAY     Psychiatry:  Antidepressants - SSRI - sertraline Failed - 04/17/2022  9:32 AM      Failed - Completed PHQ-2 or PHQ-9 in the last 360 days      Passed - AST in normal range and within 360 days    AST  Date Value Ref Range Status  02/17/2022 10 0 - 40 IU/L Final         Passed - ALT in normal range and within 360 days    ALT  Date Value Ref Range Status  02/17/2022 11 0 - 32 IU/L Final         Passed - Valid encounter within last 6 months    Recent Outpatient Visits          3 weeks ago Seizure disorder [facility])   [facility] Maple Hudson., MD   1 month ago OM (otitis media), recurrent, left   [facility] Maple Hudson., MD   2 months ago Annual physical exam   [facility] Maple Hudson., MD   4 months ago Other recurrent acute nonsuppurative otitis media of left ear   [facility] Merita Norton T, FNP   8 months ago OM (otitis media), recurrent, left   [facility] Maple Hudson., MD      Future Appointments            In 1 month Maple Hudson., MD [facility], [facility]   In 10 months Maple Hudson., MD [facility], [facility]

## 2022-04-27 ENCOUNTER — Ambulatory Visit: Payer: Self-pay | Admitting: *Deleted

## 2022-04-27 NOTE — Telephone Encounter (Signed)
  Chief Complaint: possible ear infection, dizziness Symptoms: walking side ways and using walls to ambulate.   She has a history of ear infections and mother thinks this could be her issue or the change in medication that was done last Wed.   Pt has autism does not communicate very well. Frequency: Started this morning.  The dizziness  Pertinent Negatives: Patient denies falling, ear pain or drainage however pt does not vocalize very well her complaints per mother. Disposition: [] ED /[x] Urgent Care (no appt availability in office) / [] Appointment(In office/virtual)/ []  [facility]/ [] Home Care/ [] Refused Recommended Disposition /[] [facility]/ []  Follow-up with PCP Additional Notes: Mother decided to take her on to the urgent care since no appts with [facility].   She needs her ears examined so virtual care not a good option in this situation.

## 2022-04-27 NOTE — Telephone Encounter (Signed)
Reason for Disposition  Earache  (Exceptions: brief ear pain of < 60 minutes duration, earache occurring during air travel  Answer Assessment - Initial Assessment Questions 1. LOCATION: "Which ear is involved?"     History of ear infections and has autism.     Wed. Her medication was changed.   She is walking sideways and hanging onto walls to walk.   It may be related to her ear I think 2. ONSET: "When did the ear start hurting"      Not c/o ear pain due to autism.   She is not pulling on them. 3. SEVERITY: "How bad is the pain?"  (Scale 1-10; mild, moderate or severe)   - MILD (1-3): doesn't interfere with normal activities    - MODERATE (4-7): interferes with normal activities or awakens from sleep    - SEVERE (8-10): excruciating pain, unable to do any normal activities      No 4. URI SYMPTOMS: "Do you have a runny nose or cough?"     No other symptoms 5. FEVER: "Do you have a fever?" If Yes, ask: "What is your temperature, how was it measured, and when did it start?"     No 6. CAUSE: "Have you been swimming recently?", "How often do you use Q-TIPS?", "Have you had any recent air travel or scuba diving?"     Not asked 7. OTHER SYMPTOMS: "Do you have any other symptoms?" (e.g., headache, stiff neck, dizziness, vomiting, runny nose, decreased hearing)     Dizziness and walking sideways.  Not as dizzy this morning.   No falls, no seizures.    The one medication was changed on Wed.    This morning is when the dizziness started.   She got up and was fine, ate breakfast.   When she got up from breakfast this morning the dizziness hit her. 8. PREGNANCY: "Is there any chance you are pregnant?" "When was your last menstrual period?"     Not asked  Protocols used: Davina Poke

## 2022-04-28 ENCOUNTER — Ambulatory Visit
Admission: EM | Admit: 2022-04-28 | Discharge: 2022-04-28 | Disposition: A | Discharge status: Home / Self Care | Payer: 59 | Attending: Family Medicine | Admitting: Family Medicine

## 2022-04-28 DIAGNOSIS — H6690 Otitis media, unspecified, unspecified ear: Secondary | ICD-10-CM | POA: Diagnosis not present

## 2022-04-28 DIAGNOSIS — H6122 Impacted cerumen, left ear: Secondary | ICD-10-CM | POA: Diagnosis not present

## 2022-04-28 DIAGNOSIS — R42 Dizziness and giddiness: Secondary | ICD-10-CM | POA: Diagnosis not present

## 2022-04-28 MED ORDER — CIPROFLOXACIN-DEXAMETHASONE 0.3-0.1 % OT SUSP: 4.0000 [drp] | Freq: Two times a day (BID) | OTIC | 0 refills | Status: AC

## 2022-04-28 NOTE — ED Triage Notes (Signed)
Pt c/o possible ear infection. Pt woke up with vertigo x2days.

## 2022-04-28 NOTE — Discharge Instructions (Addendum)
Stop by the pharmacy to pick up your prescriptions.  Follow up with your primary care provider as needed.  Pick up some Debrox to help with the earwax.

## 2022-04-28 NOTE — ED Provider Notes (Signed)
[facility]    CSN: [id number] Arrival date & time: 04/28/22  1647      History   Chief Complaint Chief Complaint  Patient presents with   Dizziness   Ear Problem    HPI Grace Garcia is a 26 y.o. female.   HPI  Pt brought in by dad for ear pain and dizziness that started yesterday.  Patient with history of autism and tympanic tubes but now only has tube in her right ear.  Dad reports patient usually does not complain about having dizziness.  Has been eating and drinking well.  Has otherwise been well.  Denies muffled hearing, vomiting, nausea, chest discomfort, rash, fever abdominal pain, syncope. At times her dizziness does make her sway.  Patient states that her dizziness is not like the room is spinning.  She otherwise has been at her baseline.    Past Medical History:  Diagnosis Date   Autistic behavior    Seizures [facility])     Patient Active Problem List   Diagnosis Date Noted   Otitis media 12/02/2021   Seizure disorder (HCC) 12/02/2021   Overweight (BMI 25.0-29.9) 12/02/2021   B12 deficiency 12/19/2020   High risk medication use 12/19/2020   Active autistic disorder 01/07/2016   Seizure (HCC) 04/28/2014    Past Surgical History:  Procedure Laterality Date   ASD REPAIR  2002   CARDIAC CATHETERIZATION     to repair heart defect with mesh   MYRINGOTOMY WITH TUBE PLACEMENT      OB History   No obstetric history on file.      Home Medications    Prior to Admission medications   Medication Sig Start Date End Date Taking? Authorizing Provider  ciprofloxacin-dexamethasone (CIPRODEX) OTIC suspension Place 4 drops into the left ear 2 (two) times daily for 7 days. 04/28/22 05/05/22 Yes Lynsey Ange, DO  cloBAZam (ONFI) 10 MG tablet Take 1 tablet by mouth 2 (two) times daily. 01/26/22  Yes [provider]  cyanocobalamin 1000 MCG tablet Take by mouth.   Yes [provider]  gabapentin (NEURONTIN) 300 MG capsule Take 300 mg by mouth  2 (two) times daily.   Yes [provider]  lamoTRIgine (LAMICTAL) 150 MG tablet    Yes [provider]  LORazepam (ATIVAN) 1 MG tablet TAKE 1 TABLET BY MOUTH NIGHTLY AS NEEDED FOR ANXIETY 01/22/21  Yes [provider]  Midazolam (NAYZILAM) 5 MG/0.1ML SOLN PLEASE SEE ATTACHED FOR DETAILED DIRECTIONS 01/23/21  Yes [provider]  NEOMYCIN-POLYMYXIN-HYDROCORTISONE (CORTISPORIN) 1 % SOLN OTIC solution ADMINISTER 3 DROPS INTO THE LEFT EAR TWO (2) TIMES A DAY. 05/28/21  Yes [provider]  norgestimate-ethinyl estradiol (ORTHO-CYCLEN) 0.25-35 MG-MCG tablet TAKE 1 TABLET BY MOUTH DAILY. SKIP PLACEBO PILLS 12/02/21  Yes Merita Norton T, FNP  sertraline (ZOLOFT) 50 MG tablet TAKE 1 TABLET BY MOUTH EVERY DAY 04/17/22  Yes Maple Hudson., MD  mometasone (NASONEX) 50 MCG/ACT nasal spray PLACE 2 SPRAYS INTO THE NOSE DAILY. 07/31/20 07/31/21  Maple Hudson., MD    Family History Family History  Problem Relation Age of Onset   ADD / ADHD Brother    Heart disease Paternal Grandfather    Diabetes Maternal Grandfather    Hypertension Maternal Grandfather     Social History Social History   Tobacco Use   Smoking status: Never   Smokeless tobacco: Never  Vaping Use   Vaping Use: Never used  Substance Use Topics   Alcohol use:  No   Drug use: No     Allergies   Patient has no known allergies.   Review of Systems Review of Systems :negative unless otherwise stated in HPI.      Physical Exam Triage Vital Signs ED Triage Vitals  Enc Vitals Group     BP 04/28/22 1704 107/75     Pulse Rate 04/28/22 1704 78     Resp 04/28/22 1704 18     Temp 04/28/22 1704 97.9 F (36.6 C)     Temp Source 04/28/22 1704 Oral     SpO2 04/28/22 1704 98 %     Weight 04/28/22 1702 150 lb (68 kg)     Height 04/28/22 1702 5\' 3"  (1.6 m)     Head Circumference --      Peak Flow --      Pain Score 04/28/22 1702 0     Pain Loc --      Pain Edu? --      Excl.  in GC? --    No data found.  Updated Vital Signs BP 107/75 (BP Location: Left Arm)   Pulse 78   Temp 97.9 F (36.6 C) (Oral)   Resp 18   Ht 5\' 3"  (1.6 m)   Wt 68 kg   LMP 04/21/2022   SpO2 98%   BMI 26.57 kg/m   Visual Acuity Right Eye Distance:   Left Eye Distance:   Bilateral Distance:    Right Eye Near:   Left Eye Near:    Bilateral Near:     Physical Exam  GEN:     alert, cooperative and no distress    HENT:  right TM erythematous and left TM cerumen impaction NECK:  normal ROM RESP:  clear to auscultation bilaterally, no increased work of breathing  CVS:   regular rate and rhythm Skin:   warm and dry, no rash on visible skin NEURO: Speech at baseline, no focal deficits, gross sensation intact, negative Romberg.  Negative finger to nose.    UC Treatments / Results  Labs (all labs ordered are listed, but only abnormal results are displayed) Labs Reviewed - No data to display  EKG   Radiology No results found.  Procedures Ear Cerumen Removal  Date/Time: 04/28/2022 11:40 PM  Performed by: 04/23/2022, DO Authorized by: 06/28/2022, DO   Consent:    Consent obtained:  Verbal   Consent given by:  Parent and patient   Risks, benefits, and alternatives were discussed: yes     Risks discussed:  Pain and incomplete removal   Alternatives discussed:  No treatment Universal protocol:    Patient identity confirmed:  Verbally with patient Procedure details:    Location:  L ear   Procedure type comment:  Curette and irrigation   Procedure outcomes: cerumen removed   Post-procedure details:    Inspection:  TM intact and some cerumen remaining   Hearing quality:  Improved   Procedure completion:  Tolerated  (including critical care time)  Medications Ordered in UC Medications - No data to display  Initial Impression / Assessment and Plan / UC Course  I have reviewed the triage vital signs and the nursing notes.  Pertinent labs & imaging  results that were available during my care of the patient were reviewed by me and considered in my medical decision making (see chart for details).     Acute otitis media Patient with history of recurrent otitis media.  Treat with Ciprodex.   Left  cerumen impaction Ceruminosis is noted.  Wax is removed by syringing and manual debridement. Instructions for home care to prevent wax buildup are given.  Patient reports improvement in her symptoms.     Final Clinical Impressions(s) / UC Diagnoses   Final diagnoses:  Dizziness  Impacted cerumen of left ear  Acute otitis media, unspecified otitis media type     Discharge Instructions      Stop by the pharmacy to pick up your prescriptions.  Follow up with your primary care provider as needed.     ED Prescriptions     Medication Sig Dispense Auth. Provider   ciprofloxacin-dexamethasone (CIPRODEX) OTIC suspension Place 4 drops into the left ear 2 (two) times daily for 7 days. 2.8 mL Katha Cabal, DO      PDMP not reviewed this encounter.   Katha Cabal, DO 04/28/22 2346

## 2022-05-05 ENCOUNTER — Encounter: Payer: Self-pay | Admitting: Family Medicine

## 2022-05-05 ENCOUNTER — Ambulatory Visit (INDEPENDENT_AMBULATORY_CARE_PROVIDER_SITE_OTHER): Payer: 59 | Admitting: Family Medicine

## 2022-05-05 VITALS — BP 102/67 | HR 83 | Temp 97.5°F | Wt 161.0 lb

## 2022-05-05 DIAGNOSIS — R569 Unspecified convulsions: Secondary | ICD-10-CM

## 2022-05-05 LAB — POCT URINALYSIS DIPSTICK
Bilirubin, UA: NEGATIVE
Blood, UA: NEGATIVE
Glucose, UA: NEGATIVE
Ketones, UA: NEGATIVE
Leukocytes, UA: NEGATIVE
Protein, UA: NEGATIVE
Spec Grav, UA: 1.01 (ref 1.010–1.025)
Urobilinogen, UA: 0.2 E.U./dL
pH, UA: 6 (ref 5.0–8.0)

## 2022-05-05 NOTE — Progress Notes (Signed)
Established patient visit  I,Grace Garcia,acting as a scribe for Mila Merry, MD.,have documented all relevant documentation on the behalf of Mila Merry, MD,as directed by  Mila Merry, MD while in the presence of Mila Merry, MD.   Patient: Grace Garcia   DOB: 1996-01-25   25 y.o. Female  MRN: [id number] Visit Date: 05/05/2022  Today's healthcare provider: Mila Merry, MD   Chief Complaint  Patient presents with   Seizures   Subjective    HPI  Patient had a seizure last night. Seizure lasted for 5 five minutes.  Has long history of seizure disorder managed by Dr. Sherryll Burger. Did have dose of clobazam reduced recently.   Patient's mother reports that they took her to urgent care last week for dizziness and found to have right OM and cerumen impaction of left which was irrigated. Patient's mother just wanted to make sure seizure was not triggered by other infectious process.   Medications: Outpatient Medications Prior to Visit  Medication Sig   cloBAZam (ONFI) 10 MG tablet Take 1 tablet by mouth 2 (two) times daily. Takes 1/2 Tablet in am and 1 Tablet in pm   cyanocobalamin 1000 MCG tablet Take by mouth.   gabapentin (NEURONTIN) 300 MG capsule Take 300 mg by mouth 2 (two) times daily.   lamoTRIgine (LAMICTAL) 150 MG tablet Take 3 in the am and 4 in pm   LORazepam (ATIVAN) 1 MG tablet TAKE 1 TABLET BY MOUTH NIGHTLY AS NEEDED FOR ANXIETY   Midazolam (NAYZILAM) 5 MG/0.1ML SOLN PLEASE SEE ATTACHED FOR DETAILED DIRECTIONS   norgestimate-ethinyl estradiol (ORTHO-CYCLEN) 0.25-35 MG-MCG tablet TAKE 1 TABLET BY MOUTH DAILY. SKIP PLACEBO PILLS   sertraline (ZOLOFT) 50 MG tablet TAKE 1 TABLET BY MOUTH EVERY DAY   ciprofloxacin-dexamethasone (CIPRODEX) OTIC suspension Place 4 drops into the left ear 2 (two) times daily for 7 days. (Patient not taking: Reported on 05/05/2022)   mometasone (NASONEX) 50 MCG/ACT nasal spray PLACE 2 SPRAYS INTO THE NOSE DAILY.    NEOMYCIN-POLYMYXIN-HYDROCORTISONE (CORTISPORIN) 1 % SOLN OTIC solution ADMINISTER 3 DROPS INTO THE LEFT EAR TWO (2) TIMES A DAY. (Patient not taking: Reported on 05/05/2022)   No facility-administered medications prior to visit.    Review of Systems  Constitutional:  Negative for appetite change, chills, fatigue and fever.  Respiratory:  Negative for chest tightness and shortness of breath.   Cardiovascular:  Negative for chest pain and palpitations.  Gastrointestinal:  Negative for abdominal pain, nausea and vomiting.  Neurological:  Negative for dizziness and weakness.       Objective    BP 102/67 (BP Location: Left Arm, Patient Position: Sitting, Cuff Size: Normal)   Pulse 83   Temp (!) 97.5 F (36.4 C) (Oral)   Wt 161 lb (73 kg)   LMP 04/21/2022   SpO2 99%   BMI 28.52 kg/m    Physical Exam   General Appearance:    Well developed, well nourished female, alert, cooperative, in no acute distress  HENT:   ENT exam normal, no neck nodes or sinus tenderness  Eyes:    PERRL, conjunctiva/corneas clear, EOM's intact       Lungs:     Clear to auscultation bilaterally, respirations unlabored  Heart:    Normal heart rate. Normal rhythm. No murmurs, rubs, or gallops.    Neurologic:   Awake, alert, oriented x 3. No apparent focal neurological           defect.  Results for orders placed or performed in visit on 05/05/22  POCT Urinalysis Dipstick  Result Value Ref Range   Glucose, UA Negative Negative   Bilirubin, UA Negative    Ketones, UA Negative    Spec Grav, UA 1.010 1.010 - 1.025   Blood, UA Negative    pH, UA 6.0 5.0 - 8.0   Protein, UA Negative Negative   Urobilinogen, UA 0.2 0.2 or 1.0 E.U./dL   Nitrite, UA Nitrite    Leukocytes, UA Negative Negative     Assessment & Plan     1. Seizure (HCC) Occurred last night, slightly longer that usual. No sign of infectious process at this time. Recommend contacting neurologist and make them aware.        The entirety  of the information documented in the History of Present Illness, Review of Systems and Physical Exam were personally obtained by me. Portions of this information were initially documented by the CMA and reviewed by me for thoroughness and accuracy.     Mila Merry, MD  [facility] [phone] (phone) [phone] (fax)  [facility]

## 2022-05-27 ENCOUNTER — Ambulatory Visit (INDEPENDENT_AMBULATORY_CARE_PROVIDER_SITE_OTHER): Payer: 59 | Admitting: Family Medicine

## 2022-05-27 ENCOUNTER — Encounter: Payer: Self-pay | Admitting: Family Medicine

## 2022-05-27 VITALS — BP 111/69 | HR 78 | Temp 97.7°F | Wt 162.0 lb

## 2022-05-27 DIAGNOSIS — M3500 Sicca syndrome, unspecified: Secondary | ICD-10-CM

## 2022-05-27 DIAGNOSIS — E663 Overweight: Secondary | ICD-10-CM

## 2022-05-27 DIAGNOSIS — R569 Unspecified convulsions: Secondary | ICD-10-CM

## 2022-05-27 DIAGNOSIS — Z23 Encounter for immunization: Secondary | ICD-10-CM

## 2022-05-27 DIAGNOSIS — F840 Autistic disorder: Secondary | ICD-10-CM | POA: Diagnosis not present

## 2022-05-27 DIAGNOSIS — G40909 Epilepsy, unspecified, not intractable, without status epilepticus: Secondary | ICD-10-CM | POA: Diagnosis not present

## 2022-05-27 DIAGNOSIS — H6523 Chronic serous otitis media, bilateral: Secondary | ICD-10-CM

## 2022-05-27 DIAGNOSIS — F422 Mixed obsessional thoughts and acts: Secondary | ICD-10-CM | POA: Diagnosis not present

## 2022-05-27 MED ORDER — SERTRALINE HCL 50 MG PO TABS: 75.0000 mg | ORAL_TABLET | Freq: Every day | ORAL | 3 refills | Status: AC

## 2022-05-27 NOTE — Progress Notes (Signed)
I,Roshena L Chambers,acting as a scribe for Megan Mans, MD.,have documented all relevant documentation on the behalf of Megan Mans, MD,as directed by  Megan Mans, MD while in the presence of Megan Mans, MD.    Established patient visit   Patient: Grace Garcia   DOB: [birth year]   26 y.o. Female  MRN: [id number] Visit Date: 05/27/2022  Today's healthcare provider: Megan Mans, MD   Chief Complaint  Patient presents with   Follow-up   Subjective    HPI  Patient comes in today with her mother.  She might be a little bit better on sertraline but family is willing to go up on dosing. She needs formal physical for [employer]. No issues today.  Follow up for Mixed obsessional thoughts and acts:  The patient was last seen for this on 03/26/2022.   Changes made at last visit include trying sertraline 50 mg daily. Follow-up in 2 months.  She reports good compliance with treatment. She feels that condition is Improved. She is not having side effects.   -----------------------------------------------------------------------------------------   Medications: Outpatient Medications Prior to Visit  Medication Sig   cloBAZam (ONFI) 10 MG tablet Take 1 tablet by mouth 2 (two) times daily. Takes 1/2 Tablet in am and 1 Tablet in pm   cyanocobalamin 1000 MCG tablet Take by mouth.   gabapentin (NEURONTIN) 300 MG capsule Take 300 mg by mouth 2 (two) times daily.   lamoTRIgine (LAMICTAL) 150 MG tablet Take 3 in the am and 4 in pm   LORazepam (ATIVAN) 1 MG tablet TAKE 1 TABLET BY MOUTH NIGHTLY AS NEEDED FOR ANXIETY   Midazolam (NAYZILAM) 5 MG/0.1ML SOLN PLEASE SEE ATTACHED FOR DETAILED DIRECTIONS   mometasone (NASONEX) 50 MCG/ACT nasal spray PLACE 2 SPRAYS INTO THE NOSE DAILY.   NEOMYCIN-POLYMYXIN-HYDROCORTISONE (CORTISPORIN) 1 % SOLN OTIC solution ADMINISTER 3 DROPS INTO THE LEFT EAR TWO (2) TIMES A DAY. (Patient not taking: Reported on  05/05/2022)   norgestimate-ethinyl estradiol (ORTHO-CYCLEN) 0.25-35 MG-MCG tablet TAKE 1 TABLET BY MOUTH DAILY. SKIP PLACEBO PILLS   [DISCONTINUED] sertraline (ZOLOFT) 50 MG tablet TAKE 1 TABLET BY MOUTH EVERY DAY   No facility-administered medications prior to visit.    Review of Systems  Constitutional:  Negative for appetite change, chills, fatigue and fever.  Respiratory:  Negative for chest tightness and shortness of breath.   Cardiovascular:  Negative for chest pain and palpitations.  Gastrointestinal:  Negative for abdominal pain, nausea and vomiting.  Neurological:  Negative for dizziness and weakness.        Objective    BP 111/69 (BP Location: Right Arm, Patient Position: Sitting, Cuff Size: Normal)   Pulse 78   Temp 97.7 F (36.5 C) (Oral)   Wt 162 lb (73.5 kg)   LMP 04/21/2022   SpO2 100%   BMI 28.70 kg/m  BP Readings from Last 3 Encounters:  05/27/22 111/69  05/05/22 102/67  04/28/22 107/75   Wt Readings from Last 3 Encounters:  05/27/22 162 lb (73.5 kg)  05/05/22 161 lb (73 kg)  04/28/22 150 lb (68 kg)      Physical Exam Vitals reviewed. Exam conducted with a chaperone present.  Constitutional:      General: She is not in acute distress.    Appearance: Normal appearance. She is well-developed. She is not ill-appearing or diaphoretic.  HENT:     Head: Normocephalic and atraumatic.     Right Ear: Hearing, ear canal and external ear  normal. A PE tube is present. Tympanic membrane is scarred.     Left Ear: Hearing, ear canal and external ear normal. A PE tube is present. Tympanic membrane is scarred.     Nose: Nose normal.     Mouth/Throat:     Mouth: Mucous membranes are moist.     Pharynx: Oropharynx is clear. No oropharyngeal exudate.     Comments: Cleft palate Split uvula Eyes:     General: No scleral icterus.       Right eye: No discharge.        Left eye: No discharge.     Extraocular Movements: Extraocular movements intact.      Conjunctiva/sclera: Conjunctivae normal.     Pupils: Pupils are equal, round, and reactive to light.  Neck:     Thyroid: No thyromegaly.     Vascular: No JVD.     Trachea: No tracheal deviation.  Cardiovascular:     Rate and Rhythm: Normal rate and regular rhythm.     Pulses: Normal pulses.     Heart sounds: Normal heart sounds. No murmur heard.    No friction rub. No gallop.  Pulmonary:     Effort: Pulmonary effort is normal. No respiratory distress.     Breath sounds: Normal breath sounds. No wheezing or rales.  Chest:     Chest wall: No tenderness.  Abdominal:     General: Bowel sounds are normal. There is no distension.     Palpations: Abdomen is soft. There is no mass.     Tenderness: There is no abdominal tenderness. There is no guarding or rebound.  Musculoskeletal:        General: No tenderness.     Cervical back: Neck supple. No tenderness.  Lymphadenopathy:     Cervical: No cervical adenopathy.  Skin:    General: Skin is warm and dry.     Findings: No rash.  Neurological:     Mental Status: She is alert and oriented to person, place, and time.  Psychiatric:        Mood and Affect: Mood normal.        Behavior: Behavior normal.        Thought Content: Thought content normal.        Judgment: Judgment normal.       No results found for any visits on 05/27/22.  Assessment & Plan     1. Active autistic disorder   2. Mixed obsessional thoughts and acts Sertraline to 75 mg daily. - sertraline (ZOLOFT) 50 MG tablet; Take 1.5 tablets (75 mg total) by mouth daily.  Dispense: 135 tablet; Refill: 3  3. Need for influenza vaccination  - Flu Vaccine QUAD 36+ mos IM (Fluarix/Fluzone)  4. Seizure disorder (HCC) Followed by neurology - Lamotrigine level  5. Overweight (BMI 25.0-29.9) Continue to be active.  Form physical filled out for [employer]  6. Bilateral chronic serous otitis media Both myringotomy tubes in place.  No active infection today.   8.  Sjogren's syndrome, with unspecified organ involvement [facility]) Sister recently diagnosed with Sjogren's syndrome - ANA w/Reflex   No follow-ups on file.      I, Megan Mans, MD, have reviewed all documentation for this visit. The documentation on 05/31/22 for the exam, diagnosis, procedures, and orders are all accurate and complete.    Treyden Hakim Wendelyn Breslow, MD  [facility] [phone] (phone) [phone] (fax)  [facility]

## 2022-05-29 LAB — LAMOTRIGINE LEVEL: Lamotrigine Lvl: 8.4 ug/mL (ref 2.0–20.0)

## 2022-06-04 LAB — ANA W/REFLEX: ANA Titer 1: POSITIVE — AB

## 2022-06-04 LAB — ENA+DNA/DS+ANTICH+CENTRO+FA...
Anti JO-1: <0.2 AI (ref 0.0–0.9)
Antiribosomal P Antibodies: <0.2 AI (ref 0.0–0.9)
Centromere Ab Screen: <0.2 AI (ref 0.0–0.9)
Chromatin Ab SerPl-aCnc: 0.2 AI (ref 0.0–0.9)
ENA RNP Ab: <0.2 AI (ref 0.0–0.9)
ENA SM Ab Ser-aCnc: <0.2 AI (ref 0.0–0.9)
ENA SSA (RO) Ab: <0.2 AI (ref 0.0–0.9)
ENA SSB (LA) Ab: <0.2 AI (ref 0.0–0.9)
Scleroderma (Scl-70) (ENA) Antibody, IgG: <0.2 AI (ref 0.0–0.9)
Smith/RNP Antibodies: <0.2 AI (ref 0.0–0.9)
Speckled Pattern: 1:80 {titer}
dsDNA Ab: <1 IU/mL (ref 0–9)

## 2022-06-05 ENCOUNTER — Other Ambulatory Visit (HOSPITAL_COMMUNITY): Payer: Self-pay

## 2022-06-11 NOTE — Addendum Note (Signed)
Addended by: Marlana Salvage on: 06/11/2022 10:01 AM   Modules accepted: Orders

## 2022-06-22 ENCOUNTER — Other Ambulatory Visit: Payer: Self-pay | Admitting: *Deleted

## 2022-06-22 DIAGNOSIS — R768 Other specified abnormal immunological findings in serum: Secondary | ICD-10-CM

## 2022-06-22 DIAGNOSIS — Z832 Family history of diseases of the blood and blood-forming organs and certain disorders involving the immune mechanism: Secondary | ICD-10-CM

## 2022-08-07 ENCOUNTER — Encounter: Payer: Self-pay | Admitting: Family Medicine

## 2022-08-07 ENCOUNTER — Ambulatory Visit (INDEPENDENT_AMBULATORY_CARE_PROVIDER_SITE_OTHER): Payer: 59 | Admitting: Family Medicine

## 2022-08-07 VITALS — BP 104/64 | HR 78 | Resp 16 | Ht 62.0 in | Wt 162.0 lb

## 2022-08-07 DIAGNOSIS — H9211 Otorrhea, right ear: Secondary | ICD-10-CM

## 2022-08-07 DIAGNOSIS — Z8669 Personal history of other diseases of the nervous system and sense organs: Secondary | ICD-10-CM

## 2022-08-07 DIAGNOSIS — H9201 Otalgia, right ear: Secondary | ICD-10-CM | POA: Diagnosis not present

## 2022-08-07 DIAGNOSIS — H66004 Acute suppurative otitis media without spontaneous rupture of ear drum, recurrent, right ear: Secondary | ICD-10-CM

## 2022-08-07 MED ORDER — AMOXICILLIN-POT CLAVULANATE 875-125 MG PO TABS: 1.0000 | ORAL_TABLET | Freq: Two times a day (BID) | ORAL | 0 refills | Status: DC

## 2022-08-07 MED ORDER — NEOMYCIN-POLYMYXIN-HC 3.5-10000-1 OT SOLN: 4.0000 [drp] | Freq: Four times a day (QID) | OTIC | 0 refills | Status: DC

## 2022-08-07 NOTE — Progress Notes (Signed)
Established patient visit   Patient: Grace Garcia   DOB: 22-Apr-1996   26 y.o. Female  MRN: [id number] Visit Date: 08/07/2022  Today's healthcare provider: Jacky Kindle, FNP  Re Introduced to nurse practitioner role and practice setting.  All questions answered.  Discussed provider/patient relationship and expectations.  Presents with sister Chief Complaint  Patient presents with   Ear Pain    Patient complains of R ear drainage starting yesterday.    Subjective    HPI HPI     Ear Pain    Additional comments: Patient complains of R ear drainage starting yesterday.       Last edited by Marlana Salvage, CMA on 08/07/2022  3:22 PM.       Medications: Outpatient Medications Prior to Visit  Medication Sig   cloBAZam (ONFI) 10 MG tablet Take 1 tablet by mouth 2 (two) times daily. Takes 1/2 Tablet in am and 1 Tablet in pm   cyanocobalamin 1000 MCG tablet Take by mouth.   gabapentin (NEURONTIN) 300 MG capsule Take 300 mg by mouth 2 (two) times daily.   lamoTRIgine (LAMICTAL) 150 MG tablet Take 3 in the am and 4 in pm   LORazepam (ATIVAN) 1 MG tablet TAKE 1 TABLET BY MOUTH NIGHTLY AS NEEDED FOR ANXIETY   Midazolam (NAYZILAM) 5 MG/0.1ML SOLN PLEASE SEE ATTACHED FOR DETAILED DIRECTIONS   norgestimate-ethinyl estradiol (ORTHO-CYCLEN) 0.25-35 MG-MCG tablet TAKE 1 TABLET BY MOUTH DAILY. SKIP PLACEBO PILLS   sertraline (ZOLOFT) 50 MG tablet Take 1.5 tablets (75 mg total) by mouth daily.   [DISCONTINUED] NEOMYCIN-POLYMYXIN-HYDROCORTISONE (CORTISPORIN) 1 % SOLN OTIC solution    mometasone (NASONEX) 50 MCG/ACT nasal spray PLACE 2 SPRAYS INTO THE NOSE DAILY.   No facility-administered medications prior to visit.    Review of Systems   Objective    BP 104/64 (BP Location: Left Arm, Patient Position: Sitting, Cuff Size: Normal)   Pulse 78   Resp 16   Ht 5\' 2"  (1.575 m)   Wt 162 lb (73.5 kg)   SpO2 98%   BMI 29.63 kg/m   Physical Exam Vitals and nursing note  reviewed.  Constitutional:      General: She is not in acute distress.    Appearance: Normal appearance. She is overweight. She is not ill-appearing, toxic-appearing or diaphoretic.  HENT:     Head: Normocephalic and atraumatic.     Right Ear: Ear canal normal. Drainage, swelling and tenderness present. A middle ear effusion is present. There is no impacted cerumen. Tympanic membrane is bulging.     Left Ear: Ear canal and external ear normal. A middle ear effusion is present. There is no impacted cerumen.     Nose: Nose normal.     Mouth/Throat:     Mouth: Mucous membranes are moist.     Pharynx: Oropharynx is clear.  Eyes:     Extraocular Movements: Extraocular movements intact.     Conjunctiva/sclera: Conjunctivae normal.     Pupils: Pupils are equal, round, and reactive to light.  Cardiovascular:     Rate and Rhythm: Normal rate and regular rhythm.     Pulses: Normal pulses.     Heart sounds: Normal heart sounds. No murmur heard.    No friction rub. No gallop.  Pulmonary:     Effort: Pulmonary effort is normal. No respiratory distress.     Breath sounds: Normal breath sounds. No stridor. No wheezing, rhonchi or rales.  Chest:  Chest wall: No tenderness.  Musculoskeletal:        General: No swelling, tenderness, deformity or signs of injury. Normal range of motion.     Right lower leg: No edema.     Left lower leg: No edema.  Skin:    General: Skin is warm and dry.     Capillary Refill: Capillary refill takes less than 2 seconds.     Coloration: Skin is not jaundiced or pale.     Findings: No bruising, erythema, lesion or rash.  Neurological:     General: No focal deficit present.     Mental Status: She is alert and oriented to person, place, and time. Mental status is at baseline.     Cranial Nerves: No cranial nerve deficit.     Sensory: No sensory deficit.     Motor: No weakness.     Coordination: Coordination normal.  Psychiatric:        Mood and Affect: Mood  normal.        Behavior: Behavior normal.        Thought Content: Thought content normal.        Judgment: Judgment normal.     No results found for any visits on 08/07/22.  Assessment & Plan     Problem List Items Addressed This Visit       Nervous and Auditory   Drainage from right ear    X1 occurrence; not specified in terms of description       Otitis media - Primary    Acute, stable No additional symptoms at this time Oral and topical treatment given sudden presentation  RTC prn       Relevant Medications   amoxicillin-clavulanate (AUGMENTIN) 875-125 MG tablet   neomycin-polymyxin-hydrocortisone (CORTISPORIN) OTIC solution     Other   History of recurrent ear infection    Acute on chronic, previously seen by ENT, [facility], and [facility]      Right ear pain    X1 day; associated with ear discharge       Relevant Medications   amoxicillin-clavulanate (AUGMENTIN) 875-125 MG tablet   neomycin-polymyxin-hydrocortisone (CORTISPORIN) OTIC solution   Return if symptoms worsen or fail to improve.      Leilani Merl, FNP, have reviewed all documentation for this visit. The documentation on 08/07/22 for the exam, diagnosis, procedures, and orders are all accurate and complete.  Jacky Kindle, FNP  [facility] [phone] (phone) [phone] (fax)  [facility]

## 2022-08-07 NOTE — Assessment & Plan Note (Signed)
Acute on chronic, previously seen by ENT, [facility], and [facility]

## 2022-08-07 NOTE — Assessment & Plan Note (Signed)
X1 occurrence; not specified in terms of description

## 2022-08-07 NOTE — Assessment & Plan Note (Signed)
Acute, stable No additional symptoms at this time Oral and topical treatment given sudden presentation  RTC prn

## 2022-08-07 NOTE — Assessment & Plan Note (Signed)
X1 day; associated with ear discharge

## 2022-09-11 ENCOUNTER — Ambulatory Visit (INDEPENDENT_AMBULATORY_CARE_PROVIDER_SITE_OTHER): Payer: 59 | Admitting: Family Medicine

## 2022-09-11 ENCOUNTER — Encounter: Payer: Self-pay | Admitting: Family Medicine

## 2022-09-11 ENCOUNTER — Ambulatory Visit: Payer: Self-pay

## 2022-09-11 VITALS — BP 109/65 | HR 82 | Wt 160.4 lb

## 2022-09-11 DIAGNOSIS — F840 Autistic disorder: Secondary | ICD-10-CM

## 2022-09-11 DIAGNOSIS — G40909 Epilepsy, unspecified, not intractable, without status epilepticus: Secondary | ICD-10-CM | POA: Diagnosis not present

## 2022-09-11 DIAGNOSIS — H9202 Otalgia, left ear: Secondary | ICD-10-CM | POA: Insufficient documentation

## 2022-09-11 MED ORDER — NEOMYCIN-POLYMYXIN-HC 3.5-10000-1 OT SOLN: 4.0000 [drp] | Freq: Four times a day (QID) | OTIC | 0 refills | Status: DC

## 2022-09-11 MED ORDER — AZITHROMYCIN 250 MG PO TABS: ORAL_TABLET | ORAL | 0 refills | Status: AC

## 2022-09-11 MED ORDER — DOXYCYCLINE HYCLATE 100 MG PO TABS: 100.0000 mg | ORAL_TABLET | Freq: Two times a day (BID) | ORAL | 0 refills | Status: DC

## 2022-09-11 NOTE — Assessment & Plan Note (Signed)
Acute, stable Previously treated for an R ear infection with augmentin; unable to verbalized pain on exam Normal exam at this time Printed Rx provided if symptoms worsen over the upcoming weekend including oral ABX and ear solution Family voiced understanding No sick contacts

## 2022-09-11 NOTE — Assessment & Plan Note (Addendum)
Chronic, stable Seizures roughly once every 2-3 months; typical length of 3-5 minutes Seizure last night, witnessed by patient's mother- lasted 3 minutes Minor laceration to lip; now healed without intervention No other facial trauma Family denies missed medications Notes possible stressors with upcoming travel and also noted pt pulling on left ear  Followed by Dr Sherryll Burger with neuro

## 2022-09-11 NOTE — Assessment & Plan Note (Signed)
Chronic, stable Moderately non verbal; family assists with history and examination Pt appears at baseline

## 2022-09-11 NOTE — Progress Notes (Signed)
Leo Rod Wolford,acting as a Neurosurgeon for Jacky Kindle, FNP.,have documented all relevant documentation on the behalf of Jacky Kindle, FNP,as directed by  Jacky Kindle, FNP while in the presence of Jacky Kindle, FNP.    Established patient visit   Patient: Grace Garcia   DOB: 1996-05-15   26 y.o. Female  MRN: [id number] Visit Date: 09/11/2022  Today's healthcare provider: Jacky Kindle, FNP  Re Introduced to nurse practitioner role and practice setting.  All questions answered.  Discussed provider/patient relationship and expectations.  Patient presents with brother, who notes seizure last night with lip bleeding (now stabilized without intervention) was 3 minutes in length. Family going to [place] next week and often patient gets anxious with travel.  Chief Complaint  Patient presents with   Follow-up   Subjective    Otalgia  There is pain in the left ear. This is a new problem. The current episode started yesterday. The problem has been unchanged. There has been no fever. She has tried nothing for the symptoms.    Follow up for Seizure Disorder  The patient was last seen for this 3 months ago. Changes made at last visit include none, patient followed by neurology Dr. Sherryll Burger. Patient presents with family member who states that patient had seizure last night.  She reports good compliance with treatment. She feels that condition is Unchanged. She is not having side effects.   Component Ref Range & Units 3 mo ago (05/28/22) 6 mo ago (02/17/22) 1 yr ago (12/19/20) 3 yr ago (09/06/19) 3 yr ago (06/07/19) 6 yr ago (04/29/16)  Lamotrigine Lvl 2.0 - 20.0 ug/mL 8.4 15.7 CM 9.0 CM 7.2 CM 4.5 CM 4.2 CM   ----------------------------------------------------------------------------------------- Medications: Outpatient Medications Prior to Visit  Medication Sig   cloBAZam (ONFI) 10 MG tablet Take 1 tablet by mouth 2 (two) times daily. Takes 1/2 Tablet in am and 1 Tablet in pm    cyanocobalamin 1000 MCG tablet Take by mouth.   gabapentin (NEURONTIN) 300 MG capsule Take 300 mg by mouth 2 (two) times daily.   lamoTRIgine (LAMICTAL) 150 MG tablet Take 3 in the am and 4 in pm   LORazepam (ATIVAN) 1 MG tablet TAKE 1 TABLET BY MOUTH NIGHTLY AS NEEDED FOR ANXIETY   Midazolam (NAYZILAM) 5 MG/0.1ML SOLN PLEASE SEE ATTACHED FOR DETAILED DIRECTIONS   mometasone (NASONEX) 50 MCG/ACT nasal spray PLACE 2 SPRAYS INTO THE NOSE DAILY.   norgestimate-ethinyl estradiol (ORTHO-CYCLEN) 0.25-35 MG-MCG tablet TAKE 1 TABLET BY MOUTH DAILY. SKIP PLACEBO PILLS   sertraline (ZOLOFT) 50 MG tablet Take 1.5 tablets (75 mg total) by mouth daily.   [DISCONTINUED] amoxicillin-clavulanate (AUGMENTIN) 875-125 MG tablet Take 1 tablet by mouth 2 (two) times daily.   [DISCONTINUED] neomycin-polymyxin-hydrocortisone (CORTISPORIN) OTIC solution Place 4 drops into both ears 4 (four) times daily.   No facility-administered medications prior to visit.    Review of Systems  HENT:  Positive for ear pain.      Objective    BP 109/65   Pulse 82   Wt 160 lb 6.4 oz (72.8 kg)   BMI 29.34 kg/m   Physical Exam Vitals and nursing note reviewed.  Constitutional:      General: She is not in acute distress.    Appearance: Normal appearance. She is overweight. She is not ill-appearing, toxic-appearing or diaphoretic.  HENT:     Head: Normocephalic and atraumatic.  Cardiovascular:     Rate and Rhythm: Normal rate and regular  rhythm.     Pulses: Normal pulses.     Heart sounds: Normal heart sounds. No murmur heard.    No friction rub. No gallop.  Pulmonary:     Effort: Pulmonary effort is normal. No respiratory distress.     Breath sounds: Normal breath sounds. No stridor. No wheezing, rhonchi or rales.  Chest:     Chest wall: No tenderness.  Musculoskeletal:        General: No swelling, tenderness, deformity or signs of injury. Normal range of motion.     Right lower leg: No edema.     Left lower leg: No  edema.  Skin:    General: Skin is warm and dry.     Capillary Refill: Capillary refill takes less than 2 seconds.     Coloration: Skin is not jaundiced or pale.     Findings: No bruising, erythema, lesion or rash.  Neurological:     General: No focal deficit present.     Mental Status: She is alert and oriented to person, place, and time. Mental status is at baseline.     Cranial Nerves: No cranial nerve deficit.     Sensory: No sensory deficit.     Motor: No weakness.     Coordination: Coordination normal.  Psychiatric:        Mood and Affect: Mood normal. Affect is flat.        Behavior: Behavior normal.        Judgment: Judgment normal.     No results found for any visits on 09/11/22.  Assessment & Plan     Problem List Items Addressed This Visit       Nervous and Auditory   Acute ear pain, left    Acute, stable Previously treated for an R ear infection with augmentin; unable to verbalized pain on exam Normal exam at this time Printed Rx provided if symptoms worsen over the upcoming weekend including oral ABX and ear solution Family voiced understanding No sick contacts      Relevant Medications   neomycin-polymyxin-hydrocortisone (CORTISPORIN) OTIC solution   azithromycin (ZITHROMAX) 250 MG tablet   doxycycline (VIBRA-TABS) 100 MG tablet   Seizure disorder (HCC) - Primary    Chronic, stable Seizures roughly once every 2-3 months; typical length of 3-5 minutes Seizure last night, witnessed by patient's mother- lasted 3 minutes Minor laceration to lip; now healed without intervention No other facial trauma Family denies missed medications Notes possible stressors with upcoming travel and also noted pt pulling on left ear  Followed by Dr Sherryll Burger with neuro        Other   Active autistic disorder    Chronic, stable Moderately non verbal; family assists with history and examination Pt appears at baseline       Return if symptoms worsen or fail to improve.      Leilani Merl, FNP, have reviewed all documentation for this visit. The documentation on 09/11/22 for the exam, diagnosis, procedures, and orders are all accurate and complete.  Jacky Kindle, FNP  [facility] [phone] (phone) [phone] (fax)  [facility]

## 2022-09-11 NOTE — Telephone Encounter (Signed)
     Chief Complaint: Seizure last night, history of seizures Symptoms: Above Frequency: Last night, "she is fine today." Pertinent Negatives: Patient denies any injury Disposition: [] ED /[] Urgent Care (no appt availability in office) / [x] Appointment(In office/virtual)/ []  [place] [place] [facility]/ [] Home Care/ [] Refused Recommended Disposition /[] [place] [facility]/ []  Follow-up with PCP Additional Notes: Will go to ED for worsening of symptoms.  Answer Assessment - Initial Assessment Questions 1. ONSET: "When did the seizure occur?"     Last night 2. DURATION: "How long did the seizure last (or how long has it been happening)?" (e.g., seconds, minutes)  Note: Most seizures last less than 5 minutes.     3-4 minutes 3. DESCRIPTION: "Describe what happened during the seizure." "Did the body become stiff?" "Was there any jerking?"  "Did they lose consciousness during the seizure?"     Jerking 4. CIRCUMSTANCE: "What was the person doing when the seizure began?"       5. MENTAL STATUS AFTER SEIZURE: "Does the person seem more groggy or sleepy?" "Does the person know who they are, who you are, and where they are now?"      Groggy 6. PRIOR SEIZURES: "Has the person had a seizure (convulsion) before?" (e.g., epilepsy, other cause)  If Yes, ask: "When was the last time?" and "What happened last time?"      Yes 7. EPILEPSY: "Does the person have epilepsy?" Note: Check for medical ID bracelet.     Yes 8. MEDICINES: "Does the person take anticonvulsant medications?" (e.g., Yes, No; missed doses, any recent changes)     Yes 9. INJURY: "Was the person hurt or injured during the seizure?" (e.g., hit their head, bit their tongue)     No 10. OTHER SYMPTOMS: "Are there any other symptoms?" (e.g., fever, headache)       Sleepy 11. PREGNANCY: "Is there any chance you are pregnant?" "When was your last menstrual period?"       No  Protocols used: [facility]

## 2022-10-25 ENCOUNTER — Encounter: Payer: Self-pay | Admitting: Emergency Medicine

## 2022-10-25 ENCOUNTER — Ambulatory Visit
Admission: EM | Admit: 2022-10-25 | Discharge: 2022-10-25 | Disposition: A | Discharge status: Home / Self Care | Payer: 59 | Attending: Family Medicine | Admitting: Family Medicine

## 2022-10-25 DIAGNOSIS — H6691 Otitis media, unspecified, right ear: Secondary | ICD-10-CM

## 2022-10-25 NOTE — ED Provider Notes (Signed)
[facility]    CSN: [id number] Arrival date & time: 10/25/22  1313      History   Chief Complaint Chief Complaint  Patient presents with   Otalgia    right    HPI Grace Garcia is a 27 y.o. female.   HPI   Grace Garcia presents for right ear pain with associated discharge, fullness, pressure, and tinnitus that started yesterday. Mom put Ciprodex ear drops in her ear. She has some discharge coming out of her ears. She has tubes.        Past Medical History:  Diagnosis Date   Autistic behavior    Seizures [facility])     Patient Active Problem List   Diagnosis Date Noted   Acute ear pain, left 09/11/2022   Drainage from right ear 08/07/2022   Right ear pain 08/07/2022   History of recurrent ear infection 08/07/2022   Otitis media 12/02/2021   Seizure disorder ([place]) 12/02/2021   Overweight (BMI 25.0-29.9) 12/02/2021   B12 deficiency 12/19/2020   High risk medication use 12/19/2020   Active autistic disorder 01/07/2016   Seizure ([place]) 04/28/2014    Past Surgical History:  Procedure Laterality Date   ASD REPAIR  2002   CARDIAC CATHETERIZATION     to repair heart defect with mesh   MYRINGOTOMY WITH TUBE PLACEMENT      OB History   No obstetric history on file.      Home Medications    Prior to Admission medications   Medication Sig Start Date End Date Taking? Authorizing Provider  cloBAZam (ONFI) 10 MG tablet Take 1 tablet by mouth 2 (two) times daily. Takes 1/2 Tablet in am and 1 Tablet in pm 01/26/22  Yes [provider]  cyanocobalamin 1000 MCG tablet Take by mouth.   Yes [provider]  gabapentin (NEURONTIN) 300 MG capsule Take 300 mg by mouth 2 (two) times daily.   Yes [provider]  lamoTRIgine (LAMICTAL) 150 MG tablet Take 3 in the am and 4 in pm   Yes [provider]  norgestimate-ethinyl estradiol (ORTHO-CYCLEN) 0.25-35 MG-MCG tablet TAKE 1 TABLET BY MOUTH DAILY. SKIP PLACEBO PILLS 12/02/21  Yes  Tally Joe T, FNP  sertraline (ZOLOFT) 50 MG tablet Take 1.5 tablets (75 mg total) by mouth daily. 05/27/22  Yes Eulas Post, MD  amoxicillin-clavulanate (AUGMENTIN) 875-125 MG tablet Take 1 tablet by mouth 2 (two) times daily. 10/26/22   Gwyneth Sprout, FNP  LORazepam (ATIVAN) 1 MG tablet TAKE 1 TABLET BY MOUTH NIGHTLY AS NEEDED FOR ANXIETY 01/22/21   [provider]  Midazolam (NAYZILAM) 5 MG/0.1ML SOLN PLEASE SEE ATTACHED FOR DETAILED DIRECTIONS 01/23/21   [provider]    Family History Family History  Problem Relation Age of Onset   ADD / ADHD Brother    Heart disease Paternal Grandfather    Diabetes Maternal Grandfather    Hypertension Maternal Grandfather     Social History Social History   Tobacco Use   Smoking status: Never   Smokeless tobacco: Never  Vaping Use   Vaping Use: Never used  Substance Use Topics   Alcohol use: No   Drug use: No     Allergies   Patient has no known allergies.   Review of Systems Review of Systems: :negative unless otherwise stated in HPI.      Physical Exam Triage Vital Signs ED Triage Vitals  Enc Vitals Group     BP 10/25/22 1435 108/72  Pulse Rate 10/25/22 1435 85     Resp 10/25/22 1435 14     Temp 10/25/22 1435 98.2 F (36.8 C)     Temp Source 10/25/22 1435 Oral     SpO2 10/25/22 1435 100 %     Weight 10/25/22 1431 160 lb 7.9 oz (72.8 kg)     Height 10/25/22 1431 5\' 2"  (1.575 m)     Head Circumference --      Peak Flow --      Pain Score 10/25/22 1431 4     Pain Loc --      Pain Edu? --      Excl. in [place]? --    No data found.  Updated Vital Signs BP 108/72 (BP Location: Left Arm)   Pulse 85   Temp 98.2 F (36.8 C) (Oral)   Resp 14   Ht 5\' 2"  (1.575 m)   Wt 72.8 kg   SpO2 100%   BMI 29.35 kg/m   Visual Acuity Right Eye Distance:   Left Eye Distance:   Bilateral Distance:    Right Eye Near:   Left Eye Near:    Bilateral Near:     Physical Exam GEN:     alert, non-toxic  appearing female in no distress    HENT:  mucus membranes moist, no nasal discharge, right TM  not erythematous with copious yellow-green purulent discharge, left TM normal, non-tender tragus EYES:   no scleral injection or discharge NECK:  normal ROM RESP:  no increased work of breathing, clear to auscultation bilaterally CVS:   regular rate and rhythm Skin:   warm and dry   UC Treatments / Results  Labs (all labs ordered are listed, but only abnormal results are displayed) Labs Reviewed - No data to display  EKG   Radiology No results found.  Procedures Procedures (including critical care time)  Medications Ordered in UC Medications - No data to display  Initial Impression / Assessment and Plan / UC Course  I have reviewed the triage vital signs and the nursing notes.  Pertinent labs & imaging results that were available during my care of the patient were reviewed by me and considered in my medical decision making (see chart for details).        Acute Otitis media Overall patient is well-appearing, well-hydrated and without respiratory distress. [name] is afebrile. Treat with Ciprodex BID for 7 days.  Tylenol/Motrin's as needed for fever or discomfort.  Stressed importance of hydration.  She is to follow up with her ENT if not improved in the next 7 days.    Discussed MDM, treatment plan and plan for follow-up with patient/parent who agrees with plan.   Final Clinical Impressions(s) / UC Diagnoses   Final diagnoses:  Right acute otitis media     Discharge Instructions      Use Cipro-dex  twice a day for 7 days      ED Prescriptions   None    PDMP not reviewed this encounter.   Lyndee Hensen, DO 10/28/22 1847

## 2022-10-25 NOTE — ED Triage Notes (Signed)
Patient c/o swelling, drainage and pain in her right ear that started yesterday.  Patient denies fevers.

## 2022-10-25 NOTE — Discharge Instructions (Addendum)
Use Cipro-dex  twice a day for 7 days

## 2022-10-26 ENCOUNTER — Ambulatory Visit (INDEPENDENT_AMBULATORY_CARE_PROVIDER_SITE_OTHER): Payer: 59 | Admitting: Family Medicine

## 2022-10-26 ENCOUNTER — Encounter: Payer: Self-pay | Admitting: Family Medicine

## 2022-10-26 VITALS — BP 108/77 | HR 92 | Temp 97.5°F | Wt 161.4 lb

## 2022-10-26 DIAGNOSIS — H9211 Otorrhea, right ear: Secondary | ICD-10-CM | POA: Diagnosis not present

## 2022-10-26 DIAGNOSIS — H9201 Otalgia, right ear: Secondary | ICD-10-CM

## 2022-10-26 MED ORDER — AMOXICILLIN-POT CLAVULANATE 875-125 MG PO TABS: 1.0000 | ORAL_TABLET | Freq: Two times a day (BID) | ORAL | 0 refills | Status: DC

## 2022-10-26 NOTE — Assessment & Plan Note (Signed)
Worsening symptoms in past 24 hours; was seen at [facility]- continue Rx ABX gtts; however, unable to access ear canal due to acute inflammation Recommend oral antibiotics to assist Referral back to ENT given frequency of infections

## 2022-10-26 NOTE — Patient Instructions (Signed)
Oral antibiotic was started; take every 12 hours until prescription is finished.  Referral back to [facility] for increasing incurrences of ear infections.  Continue to use OTC medications for pain, tylenol or ibuprofen.

## 2022-10-26 NOTE — Progress Notes (Signed)
I,Connie R Striblin,acting as a Education administrator for Gwyneth Sprout, FNP.,have documented all relevant documentation on the behalf of Gwyneth Sprout, FNP,as directed by  Gwyneth Sprout, FNP while in the presence of Gwyneth Sprout, FNP.  Established patient visit  Patient: Grace Garcia   DOB: 1996/01/16   27 y.o. Female  MRN: [id number] Visit Date: 10/26/2022  Today's healthcare provider: Gwyneth Sprout, FNP  Introduced to nurse practitioner role and practice setting.  All questions answered.  Discussed provider/patient relationship and expectations.  Chief Complaint  Patient presents with   Ear Pain   Subjective    HPI  Possible Ear Infection She complains of   R ear pain and swelling .with no fever, chills, night sweats or weight loss. Onset of symptoms was last night and rapidly worsening.She is drinking plenty of fluids.  Past history is significant for  ear infections .   ---------------------------------------------------------------------------------------------------  Medications: Outpatient Medications Prior to Visit  Medication Sig   cloBAZam (ONFI) 10 MG tablet Take 1 tablet by mouth 2 (two) times daily. Takes 1/2 Tablet in am and 1 Tablet in pm   cyanocobalamin 1000 MCG tablet Take by mouth.   gabapentin (NEURONTIN) 300 MG capsule Take 300 mg by mouth 2 (two) times daily.   lamoTRIgine (LAMICTAL) 150 MG tablet Take 3 in the am and 4 in pm   LORazepam (ATIVAN) 1 MG tablet TAKE 1 TABLET BY MOUTH NIGHTLY AS NEEDED FOR ANXIETY   Midazolam (NAYZILAM) 5 MG/0.1ML SOLN PLEASE SEE ATTACHED FOR DETAILED DIRECTIONS   norgestimate-ethinyl estradiol (ORTHO-CYCLEN) 0.25-35 MG-MCG tablet TAKE 1 TABLET BY MOUTH DAILY. SKIP PLACEBO PILLS   sertraline (ZOLOFT) 50 MG tablet Take 1.5 tablets (75 mg total) by mouth daily.   [DISCONTINUED] doxycycline (VIBRA-TABS) 100 MG tablet Take 1 tablet (100 mg total) by mouth 2 (two) times daily.   [DISCONTINUED] mometasone (NASONEX) 50 MCG/ACT nasal spray  PLACE 2 SPRAYS INTO THE NOSE DAILY.   [DISCONTINUED] neomycin-polymyxin-hydrocortisone (CORTISPORIN) OTIC solution Place 4 drops into both ears 4 (four) times daily.   No facility-administered medications prior to visit.    Review of Systems    Objective    BP 108/77 (BP Location: Left Arm, Patient Position: Sitting, Cuff Size: Normal)   Pulse 92   Temp (!) 97.5 F (36.4 C) (Oral)   Wt 161 lb 6.4 oz (73.2 kg)   SpO2 98%   BMI 29.52 kg/m   Physical Exam Vitals and nursing note reviewed.  Constitutional:      General: She is not in acute distress.    Appearance: Normal appearance. She is overweight. She is not ill-appearing, toxic-appearing or diaphoretic.  HENT:     Head: Normocephalic and atraumatic.     Right Ear: Drainage, swelling and tenderness present.     Left Ear: Tympanic membrane, ear canal and external ear normal.     Ears:     Comments: Unable to visualize R TM d/t acute edema and drainage     Nose:     Right Sinus: Maxillary sinus tenderness present. No frontal sinus tenderness.     Left Sinus: No maxillary sinus tenderness or frontal sinus tenderness.     Comments: Likely referred from acute R ear inflammation     Mouth/Throat:     Lips: Pink.     Mouth: Mucous membranes are moist.     Pharynx: Oropharynx is clear. No pharyngeal swelling or posterior oropharyngeal erythema.     Tonsils: No tonsillar exudate.  Cardiovascular:     Rate and Rhythm: Normal rate and regular rhythm.     Pulses: Normal pulses.     Heart sounds: Normal heart sounds. No murmur heard.    No friction rub. No gallop.  Pulmonary:     Effort: Pulmonary effort is normal. No respiratory distress.     Breath sounds: Normal breath sounds. No stridor. No wheezing, rhonchi or rales.  Chest:     Chest wall: No tenderness.  Abdominal:     General: Bowel sounds are normal.     Palpations: Abdomen is soft.  Musculoskeletal:        General: No swelling, tenderness, deformity or signs of  injury. Normal range of motion.     Right lower leg: No edema.     Left lower leg: No edema.  Skin:    General: Skin is warm and dry.     Capillary Refill: Capillary refill takes less than 2 seconds.     Coloration: Skin is not jaundiced or pale.     Findings: No bruising, erythema, lesion or rash.  Neurological:     General: No focal deficit present.     Mental Status: She is alert and oriented to person, place, and time. Mental status is at baseline.     Cranial Nerves: No cranial nerve deficit.     Sensory: No sensory deficit.     Motor: No weakness.     Coordination: Coordination normal.  Psychiatric:        Mood and Affect: Mood normal. Affect is flat.        Behavior: Behavior normal. Behavior is cooperative.        Thought Content: Thought content normal.        Judgment: Judgment normal.     No results found for any visits on 10/26/22.  Assessment & Plan     Problem List Items Addressed This Visit       Nervous and Auditory   Drainage from right ear - Primary    Worsening symptoms in past 24 hours; was seen at [facility]- continue Rx ABX gtts; however, unable to access ear canal due to acute inflammation Recommend oral antibiotics to assist Referral back to ENT given frequency of infections       Relevant Medications   amoxicillin-clavulanate (AUGMENTIN) 875-125 MG tablet   Other Relevant Orders   Ambulatory referral to ENT     Other   Right ear pain    Worsening symptoms in past 24 hours; was seen at [facility]- continue Rx ABX gtts; however, unable to access ear canal due to acute inflammation Recommend oral antibiotics to assist Referral back to ENT given frequency of infections       Relevant Medications   amoxicillin-clavulanate (AUGMENTIN) 875-125 MG tablet   Other Relevant Orders   Ambulatory referral to ENT   Return if symptoms worsen or fail to improve.     Vonna Kotyk, FNP, have reviewed all documentation for this visit. The documentation on 10/26/22 for  the exam, diagnosis, procedures, and orders are all accurate and complete.  Gwyneth Sprout, [place] [phone] (phone) [phone] (fax)  [place]

## 2022-10-26 NOTE — Assessment & Plan Note (Signed)
Worsening symptoms in past 24 hours; [facility]- continue Rx ABX gtts; however, unable to access ear canal due to acute inflammation Recommend oral antibiotics to assist Referral back to ENT given frequency of infections  

## 2023-01-23 ENCOUNTER — Other Ambulatory Visit: Payer: Self-pay | Admitting: Family Medicine

## 2023-02-16 ENCOUNTER — Encounter: Payer: 59 | Admitting: Family Medicine

## 2023-11-28 ENCOUNTER — Ambulatory Visit
Admission: RE | Admit: 2023-11-28 | Discharge: 2023-11-28 | Disposition: A | Discharge status: Home / Self Care | Source: Ambulatory Visit | Attending: Physician Assistant | Admitting: Physician Assistant

## 2023-11-28 VITALS — Ht 64.0 in | Wt 160.0 lb

## 2023-11-28 DIAGNOSIS — L0201 Cutaneous abscess of face: Secondary | ICD-10-CM | POA: Diagnosis not present

## 2023-11-28 MED ORDER — DOXYCYCLINE HYCLATE 100 MG PO CAPS: 100.0000 mg | ORAL_CAPSULE | Freq: Two times a day (BID) | ORAL | 0 refills | Status: AC

## 2023-11-28 NOTE — ED Provider Notes (Signed)
 [facility]    CSN: [id number] Arrival date & time: 11/28/23  1230      History   Chief Complaint Chief Complaint  Patient presents with   Abscess    Appointment    HPI Grace Garcia is a 28 y.o. female with history of autism.  She presents with her mother today who is helping provide the medical history.  Patient was seen initially at [facility] clinic a couple of weeks ago for abscess of face, just below the left nostril.  She was placed on a 10-day course of Augmentin and mother reports the symptoms have gotten about 50% better.  She has since seen the dentist and no infections have been ruled out.  She is no longer having tenderness in this area and never had any pustular drainage.  Area was also treated with warm compresses.  Mother is concerned since the area is still red and swollen.  No history of HSV or MRSA.  No other concerns.  HPI  Past Medical History:  Diagnosis Date   Autistic behavior    Seizures [facility])     Patient Active Problem List   Diagnosis Date Noted   Acute ear pain, left 09/11/2022   Drainage from right ear 08/07/2022   Right ear pain 08/07/2022   History of recurrent ear infection 08/07/2022   Otitis media 12/02/2021   Seizure disorder (HCC) 12/02/2021   Overweight (BMI 25.0-29.9) 12/02/2021   B12 deficiency 12/19/2020   High risk medication use 12/19/2020   Active autistic disorder 01/07/2016   Seizure (HCC) 04/28/2014    Past Surgical History:  Procedure Laterality Date   ASD REPAIR  2002   CARDIAC CATHETERIZATION     to repair heart defect with mesh   MYRINGOTOMY WITH TUBE PLACEMENT      OB History   No obstetric history on file.      Home Medications    Prior to Admission medications   Medication Sig Start Date End Date Taking? Authorizing Provider  cloBAZam (ONFI) 10 MG tablet Take 1 tablet by mouth 2 (two) times daily. Takes 1/2 Tablet in am and 1 Tablet in pm 01/26/22  Yes [provider]  cyanocobalamin  1000 MCG tablet Take by mouth.   Yes [provider]  doxycycline (VIBRAMYCIN) 100 MG capsule Take 1 capsule (100 mg total) by mouth 2 (two) times daily for 10 days. 11/28/23 12/08/23 Yes Eusebio Friendly B, PA-C  gabapentin (NEURONTIN) 300 MG capsule Take 300 mg by mouth 2 (two) times daily.   Yes [provider]  lamoTRIgine (LAMICTAL) 150 MG tablet Take 3 in the am and 4 in pm   Yes [provider]  LORazepam (ATIVAN) 1 MG tablet TAKE 1 TABLET BY MOUTH NIGHTLY AS NEEDED FOR ANXIETY 01/22/21  Yes [provider]  Midazolam (NAYZILAM) 5 MG/0.1ML SOLN PLEASE SEE ATTACHED FOR DETAILED DIRECTIONS 01/23/21  Yes [provider]  norgestimate-ethinyl estradiol (ORTHO-CYCLEN) 0.25-35 MG-MCG tablet TAKE 1 TABLET BY MOUTH DAILY. SKIP PLACEBO PILLS 01/25/23  Yes Merita Norton T, FNP  sertraline (ZOLOFT) 50 MG tablet Take 1.5 tablets (75 mg total) by mouth daily. 05/27/22  Yes Bosie Clos, MD    Family History Family History  Problem Relation Age of Onset   ADD / ADHD Brother    Heart disease Paternal Grandfather    Diabetes Maternal Grandfather    Hypertension Maternal Grandfather     Social History Social History   Tobacco Use   Smoking status:  Never   Smokeless tobacco: Never  Vaping Use   Vaping status: Never Used  Substance Use Topics   Alcohol use: No   Drug use: No     Allergies   Patient has no known allergies.   Review of Systems Review of Systems  Constitutional:  Negative for fatigue and fever.  HENT:  Negative for congestion, dental problem and rhinorrhea.   Skin:  Positive for color change. Negative for wound.  Neurological:  Negative for weakness and headaches.  Hematological:  Negative for adenopathy.     Physical Exam Triage Vital Signs ED Triage Vitals  Encounter Vitals Group     BP 11/28/23 1324 (P) 113/73     Systolic BP Percentile --      Diastolic BP Percentile --      Pulse Rate 11/28/23 1324 (P) 79     Resp  --      Temp 11/28/23 1324 (P) 98 F (36.7 C)     Temp Source 11/28/23 1324 (P) Oral     SpO2 11/28/23 1324 (P) 100 %     Weight 11/28/23 1322 160 lb (72.6 kg)     Height 11/28/23 1322 5\' 4"  (1.626 m)     Head Circumference --      Peak Flow --      Pain Score 11/28/23 1321 0     Pain Loc --      Pain Education --      Exclude from Growth Chart --    No data found.  Updated Vital Signs BP (P) 113/73 (BP Location: Left Arm)   Pulse (P) 79   Temp (P) 98 F (36.7 C) (Oral)   Ht 5\' 4"  (1.626 m)   Wt 160 lb (72.6 kg)   SpO2 (P) 100%   BMI 27.46 kg/m      Physical Exam Vitals and nursing note reviewed.  Constitutional:      General: She is not in acute distress.    Appearance: Normal appearance. She is not ill-appearing or toxic-appearing.  HENT:     Head: Normocephalic and atraumatic.     Nose: Congestion present.     Mouth/Throat:     Mouth: Mucous membranes are moist.     Pharynx: Oropharynx is clear.  Eyes:     General: No scleral icterus.       Right eye: No discharge.        Left eye: No discharge.     Conjunctiva/sclera: Conjunctivae normal.  Cardiovascular:     Rate and Rhythm: Normal rate and regular rhythm.     Heart sounds: Normal heart sounds.  Pulmonary:     Effort: Pulmonary effort is normal. No respiratory distress.     Breath sounds: Normal breath sounds.  Musculoskeletal:     Cervical back: Neck supple.  Skin:    General: Skin is dry.     Findings: Erythema present.     Comments: See image included in photo.  There is a small area of erythema and induration without fluctuance below the left nostril.  Area appears to be slightly tender.  No obvious dental infections.  She has nasal congestion without drainage.  Neurological:     General: No focal deficit present.     Mental Status: She is alert. Mental status is at baseline.     Motor: No weakness.     Gait: Gait normal.  Psychiatric:        Mood and Affect: Mood normal.  Behavior:  Behavior normal.      UC Treatments / Results  Labs (all labs ordered are listed, but only abnormal results are displayed) Labs Reviewed - No data to display  EKG   Radiology No results found.  Procedures Procedures (including critical care time)  Medications Ordered in UC Medications - No data to display  Initial Impression / Assessment and Plan / UC Course  I have reviewed the triage vital signs and the nursing notes.  Pertinent labs & imaging results that were available during my care of the patient were reviewed by me and considered in my medical decision making (see chart for details).   28 year old female presents for greater than 2-week history of abscess below the left nostril.  Has been treated with 10-day course of Augmentin and symptoms of gotten at least 50% better.  No associated fever.  Area is not really tender anymore.  No history of HSV or MRSA.  Vitals are stable and normal and the patient is overall well-appearing.  Her mother is helping to provide some of the medical history since patient is autistic.  An image was included in the chart.  Consistent with cellulitis and small abscess.  Will treat this time with doxycycline since she does not have resolution of symptoms with Augmentin.  Covering for staph and MRSA.  Advised continuing warm compresses, rest and fluids.  Advised to return if symptoms not resolved after these antibiotics or if symptoms worsen at any point.   Final Clinical Impressions(s) / UC Diagnoses   Final diagnoses:  Abscess of face     Discharge Instructions      -I sent a new antibiotic to the pharmacy.  Take full course.  Continue using warm compresses, rest and fluids. - Follow-up after completion of antibiotics if symptoms persist or return sooner if fever or worsening symptoms.     ED Prescriptions     Medication Sig Dispense Auth. Provider   doxycycline (VIBRAMYCIN) 100 MG capsule Take 1 capsule (100 mg total) by mouth 2  (two) times daily for 10 days. 20 capsule Shirlee Latch, PA-C      PDMP not reviewed this encounter.   Shirlee Latch, PA-C 11/28/23 1407

## 2023-11-28 NOTE — Discharge Instructions (Addendum)
-  I sent a new antibiotic to the pharmacy.  Take full course.  Continue using warm compresses, rest and fluids. - Follow-up after completion of antibiotics if symptoms persist or return sooner if fever or worsening symptoms.

## 2023-11-28 NOTE — ED Triage Notes (Addendum)
 Pt is with her mom  Pt c/o abscess under left nares x2weeks  Pt was seen at [facility] clinic and was given antibiotics for facial swelling and dental pain  Pt was seen at her dentist and had xrays done and was told she does not have a dental infection.  Pt states that her face does not hurt
